# Patient Record
Sex: Male | Born: 1990
Health system: Southern US, Community
[De-identification: ages and names within clinical notes are randomized; demographics above are authoritative.]

## PROBLEM LIST (undated history)

## (undated) DIAGNOSIS — S83242A Other tear of medial meniscus, current injury, left knee, initial encounter: Secondary | ICD-10-CM

## (undated) DIAGNOSIS — J45909 Unspecified asthma, uncomplicated: Secondary | ICD-10-CM

## (undated) DIAGNOSIS — S83207D Unspecified tear of unspecified meniscus, current injury, left knee, subsequent encounter: Secondary | ICD-10-CM

## (undated) DIAGNOSIS — S83512D Sprain of anterior cruciate ligament of left knee, subsequent encounter: Secondary | ICD-10-CM

## (undated) HISTORY — DX: Unspecified asthma, uncomplicated: J45.909

## (undated) HISTORY — PX: NO PAST SURGERIES: SHX2092

---
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
## 2017-03-21 DIAGNOSIS — J302 Other seasonal allergic rhinitis: Secondary | ICD-10-CM | POA: Diagnosis not present

## 2017-03-21 DIAGNOSIS — J209 Acute bronchitis, unspecified: Secondary | ICD-10-CM | POA: Diagnosis not present

## 2017-05-23 DIAGNOSIS — M654 Radial styloid tenosynovitis [de Quervain]: Secondary | ICD-10-CM | POA: Diagnosis not present

## 2017-06-10 ENCOUNTER — Emergency Department (HOSPITAL_COMMUNITY)
Admission: EM | Admit: 2017-06-10 | Discharge: 2017-06-10 | Disposition: A | Discharge status: Home / Self Care | Payer: Managed Care, Other (non HMO) | Attending: Emergency Medicine | Admitting: Emergency Medicine

## 2017-06-10 ENCOUNTER — Emergency Department (HOSPITAL_COMMUNITY): Payer: Managed Care, Other (non HMO)

## 2017-06-10 ENCOUNTER — Encounter (HOSPITAL_COMMUNITY): Payer: Self-pay | Admitting: Emergency Medicine

## 2017-06-10 DIAGNOSIS — M79645 Pain in left finger(s): Secondary | ICD-10-CM | POA: Diagnosis present

## 2017-06-10 NOTE — ED Notes (Signed)
Pt went to move his car and has not come back.

## 2017-06-10 NOTE — ED Notes (Signed)
Pt from home with c/o left middle finger pain following a flag football injury. Pt had good cap refill to finger. No obvious deformity or dislocation

## 2017-07-11 DIAGNOSIS — S83242A Other tear of medial meniscus, current injury, left knee, initial encounter: Secondary | ICD-10-CM | POA: Diagnosis not present

## 2017-07-11 DIAGNOSIS — M79645 Pain in left finger(s): Secondary | ICD-10-CM | POA: Diagnosis not present

## 2017-07-17 DIAGNOSIS — M25562 Pain in left knee: Secondary | ICD-10-CM | POA: Diagnosis not present

## 2017-07-19 DIAGNOSIS — S83512A Sprain of anterior cruciate ligament of left knee, initial encounter: Secondary | ICD-10-CM | POA: Diagnosis not present

## 2017-07-20 DIAGNOSIS — M24542 Contracture, left hand: Secondary | ICD-10-CM | POA: Diagnosis not present

## 2017-07-20 DIAGNOSIS — M79645 Pain in left finger(s): Secondary | ICD-10-CM | POA: Insufficient documentation

## 2017-07-20 DIAGNOSIS — S63633A Sprain of interphalangeal joint of left middle finger, initial encounter: Secondary | ICD-10-CM | POA: Diagnosis not present

## 2017-07-24 ENCOUNTER — Encounter (HOSPITAL_BASED_OUTPATIENT_CLINIC_OR_DEPARTMENT_OTHER): Payer: Self-pay | Admitting: Physician Assistant

## 2017-07-24 DIAGNOSIS — S83242A Other tear of medial meniscus, current injury, left knee, initial encounter: Secondary | ICD-10-CM | POA: Diagnosis present

## 2017-07-24 DIAGNOSIS — S83207D Unspecified tear of unspecified meniscus, current injury, left knee, subsequent encounter: Secondary | ICD-10-CM

## 2017-07-24 DIAGNOSIS — S83512D Sprain of anterior cruciate ligament of left knee, subsequent encounter: Secondary | ICD-10-CM

## 2017-07-24 NOTE — H&P (Signed)
Curtis RiegerGilbert Deleon is an 26 y.o. male.   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  He had a second injury three weeks later playing basketball.  He saw Dr. Farris HasKramer who ordered an MRI that has revealed a complete ACL tear.  He has been in a J&J brace.  He cannot do any kind of turning or twisting activities without feelings of instability and pain.    Past Medical History:  Diagnosis Date  . Acute medial meniscus tear of left knee   . Tears of meniscus and ACL of left knee, subsequent encounter     No past surgical history on file.  No family history on file. Social History:  reports that he has never smoked. He has never used smokeless tobacco. He reports that he does not drink alcohol or use drugs.  Allergies: No Known Allergies  No prescriptions prior to admission.    No results found for this or any previous visit (from the past 48 hour(s)). No results found.  Review of Systems  Constitutional: Negative.   HENT: Negative.   Eyes: Negative.   Respiratory: Negative.   Cardiovascular: Negative.   Gastrointestinal: Negative.   Genitourinary: Negative.   Musculoskeletal: Positive for joint pain.  Skin: Negative.   Neurological: Negative.   Endo/Heme/Allergies: Negative.   Psychiatric/Behavioral: Negative.     There were no vitals taken for this visit. Physical Exam  Constitutional: He is oriented to person, place, and time. He appears well-developed and well-nourished.  HENT:  Head: Normocephalic and atraumatic.  Eyes: Pupils are equal, round, and reactive to light. Conjunctivae are normal.  Neck: Neck supple.  Cardiovascular: Normal rate.   GI: Soft.  Genitourinary:  Genitourinary Comments: Not pertinent to current symptomatology therefore not examined.  Musculoskeletal:   Examination of his left  knee reveals 1+ effusion.  2-3+ Lachman.  Range of motion is from 0-120 degrees.  Knee is otherwise stable with normal patella tracking.  Examination of the right knee reveals full range of motion without pain, swelling, weakness or instability.  Vascular exam: Pulses are 2+ and symmetric.    Neurological: He is alert and oriented to person, place, and time.  Skin: Skin is warm and dry.  Psychiatric: He has a normal mood and affect.     Assessment Principal Problem:   Tears of meniscus and ACL of left knee, subsequent encounter Active Problems:   Acute medial meniscus tear of left knee   Plan I have talked to him about this in detail.  Would recommend with these findings that we proceed with left knee hamstring autograft ACL reconstruction with attention to his meniscal pathology.  Risks, complications and benefits of the surgery have been described to him in detail and he understands this completely.    Pascal LuxSHEPPERSON,Willetta York J, PA-C 07/26/2017, 4:35 PM

## 2017-08-01 ENCOUNTER — Encounter (HOSPITAL_BASED_OUTPATIENT_CLINIC_OR_DEPARTMENT_OTHER): Payer: Self-pay | Admitting: *Deleted

## 2017-08-07 ENCOUNTER — Encounter (HOSPITAL_BASED_OUTPATIENT_CLINIC_OR_DEPARTMENT_OTHER): Payer: Self-pay | Admitting: *Deleted

## 2017-08-07 ENCOUNTER — Ambulatory Visit (HOSPITAL_BASED_OUTPATIENT_CLINIC_OR_DEPARTMENT_OTHER)
Admission: RE | Admit: 2017-08-07 | Discharge: 2017-08-07 | Disposition: A | Discharge status: Home / Self Care | Payer: 59 | Source: Ambulatory Visit | Attending: Orthopedic Surgery | Admitting: Orthopedic Surgery

## 2017-08-07 ENCOUNTER — Encounter (HOSPITAL_BASED_OUTPATIENT_CLINIC_OR_DEPARTMENT_OTHER)
Admission: RE | Disposition: A | Discharge status: Home / Self Care | Payer: Self-pay | Source: Ambulatory Visit | Attending: Orthopedic Surgery

## 2017-08-07 ENCOUNTER — Ambulatory Visit (HOSPITAL_BASED_OUTPATIENT_CLINIC_OR_DEPARTMENT_OTHER): Payer: 59 | Admitting: Anesthesiology

## 2017-08-07 DIAGNOSIS — X501XXA Overexertion from prolonged static or awkward postures, initial encounter: Secondary | ICD-10-CM | POA: Insufficient documentation

## 2017-08-07 DIAGNOSIS — Y9363 Activity, rugby: Secondary | ICD-10-CM | POA: Insufficient documentation

## 2017-08-07 DIAGNOSIS — S83512A Sprain of anterior cruciate ligament of left knee, initial encounter: Secondary | ICD-10-CM | POA: Insufficient documentation

## 2017-08-07 DIAGNOSIS — S83242A Other tear of medial meniscus, current injury, left knee, initial encounter: Secondary | ICD-10-CM | POA: Diagnosis not present

## 2017-08-07 DIAGNOSIS — X58XXXA Exposure to other specified factors, initial encounter: Secondary | ICD-10-CM | POA: Insufficient documentation

## 2017-08-07 DIAGNOSIS — S83509A Sprain of unspecified cruciate ligament of unspecified knee, initial encounter: Secondary | ICD-10-CM | POA: Diagnosis not present

## 2017-08-07 DIAGNOSIS — S83512D Sprain of anterior cruciate ligament of left knee, subsequent encounter: Secondary | ICD-10-CM

## 2017-08-07 DIAGNOSIS — Y9367 Activity, basketball: Secondary | ICD-10-CM | POA: Diagnosis not present

## 2017-08-07 DIAGNOSIS — S83282A Other tear of lateral meniscus, current injury, left knee, initial encounter: Secondary | ICD-10-CM | POA: Diagnosis not present

## 2017-08-07 DIAGNOSIS — G8918 Other acute postprocedural pain: Secondary | ICD-10-CM | POA: Diagnosis not present

## 2017-08-07 DIAGNOSIS — S83207D Unspecified tear of unspecified meniscus, current injury, left knee, subsequent encounter: Secondary | ICD-10-CM

## 2017-08-07 DIAGNOSIS — S83249A Other tear of medial meniscus, current injury, unspecified knee, initial encounter: Secondary | ICD-10-CM | POA: Diagnosis not present

## 2017-08-07 HISTORY — DX: Unspecified tear of unspecified meniscus, current injury, left knee, subsequent encounter: S83.207D

## 2017-08-07 HISTORY — PX: KNEE ARTHROSCOPY WITH ANTERIOR CRUCIATE LIGAMENT (ACL) REPAIR WITH HAMSTRING GRAFT: SHX5645

## 2017-08-07 HISTORY — DX: Other tear of medial meniscus, current injury, left knee, initial encounter: S83.242A

## 2017-08-07 HISTORY — PX: KNEE ARTHROSCOPY WITH MEDIAL MENISECTOMY: SHX5651

## 2017-08-07 HISTORY — DX: Unspecified tear of unspecified meniscus, current injury, left knee, subsequent encounter: S83.512D

## 2017-08-07 SURGERY — ARTHROSCOPY, KNEE, WITH MEDIAL MENISCECTOMY
Anesthesia: General | Site: Knee | Laterality: Left

## 2017-08-07 MED ORDER — FENTANYL CITRATE (PF) 100 MCG/2ML IJ SOLN
INTRAMUSCULAR | Status: AC
  Filled 2017-08-07: qty 2

## 2017-08-07 MED ORDER — DEXAMETHASONE SODIUM PHOSPHATE 4 MG/ML IJ SOLN
INTRAMUSCULAR | Status: DC | PRN
  Administered 2017-08-07: 10 mg via INTRAVENOUS

## 2017-08-07 MED ORDER — PROPOFOL 10 MG/ML IV BOLUS
INTRAVENOUS | Status: DC | PRN
  Administered 2017-08-07: 250 mg via INTRAVENOUS

## 2017-08-07 MED ORDER — FENTANYL CITRATE (PF) 100 MCG/2ML IJ SOLN
25.0000 ug | INTRAMUSCULAR | Status: DC | PRN
  Administered 2017-08-07: 50 ug via INTRAVENOUS
  Administered 2017-08-07 (×2): 25 ug via INTRAVENOUS

## 2017-08-07 MED ORDER — CYCLOBENZAPRINE HCL 5 MG PO TABS: 5.0000 mg | ORAL_TABLET | Freq: Three times a day (TID) | ORAL | 0 refills | Status: DC | PRN

## 2017-08-07 MED ORDER — CEFAZOLIN SODIUM-DEXTROSE 2-4 GM/100ML-% IV SOLN
2.0000 g | INTRAVENOUS | Status: AC
  Administered 2017-08-07: 2 g via INTRAVENOUS

## 2017-08-07 MED ORDER — POVIDONE-IODINE 7.5 % EX SOLN: Freq: Once | CUTANEOUS | Status: DC

## 2017-08-07 MED ORDER — BUPIVACAINE-EPINEPHRINE 0.25% -1:200000 IJ SOLN
INTRAMUSCULAR | Status: AC
  Filled 2017-08-07: qty 2

## 2017-08-07 MED ORDER — BUPIVACAINE-EPINEPHRINE (PF) 0.5% -1:200000 IJ SOLN
INTRAMUSCULAR | Status: DC | PRN
  Administered 2017-08-07: 20 mL via PERINEURAL

## 2017-08-07 MED ORDER — LIDOCAINE HCL (CARDIAC) 20 MG/ML IV SOLN
INTRAVENOUS | Status: DC | PRN
  Administered 2017-08-07: 50 mg via INTRAVENOUS

## 2017-08-07 MED ORDER — DIAZEPAM 5 MG/ML IJ SOLN
2.5000 mg | INTRAMUSCULAR | Status: AC | PRN
  Administered 2017-08-07 (×2): 2.5 mg via INTRAVENOUS

## 2017-08-07 MED ORDER — MIDAZOLAM HCL 2 MG/2ML IJ SOLN
1.0000 mg | INTRAMUSCULAR | Status: DC | PRN
  Administered 2017-08-07: 2 mg via INTRAVENOUS

## 2017-08-07 MED ORDER — CEFAZOLIN SODIUM-DEXTROSE 2-4 GM/100ML-% IV SOLN
INTRAVENOUS | Status: AC
Start: 2017-08-07 — End: 2017-08-07
  Filled 2017-08-07: qty 100

## 2017-08-07 MED ORDER — FENTANYL CITRATE (PF) 100 MCG/2ML IJ SOLN
50.0000 ug | INTRAMUSCULAR | Status: AC | PRN
  Administered 2017-08-07 (×2): 50 ug via INTRAVENOUS
  Administered 2017-08-07 (×2): 25 ug via INTRAVENOUS

## 2017-08-07 MED ORDER — LACTATED RINGERS IV SOLN
INTRAVENOUS | Status: DC
  Administered 2017-08-07: 13:00:00 via INTRAVENOUS
  Administered 2017-08-07: 10 mL/h via INTRAVENOUS
  Administered 2017-08-07: 11:00:00 via INTRAVENOUS

## 2017-08-07 MED ORDER — BUPIVACAINE-EPINEPHRINE 0.25% -1:200000 IJ SOLN
INTRAMUSCULAR | Status: DC | PRN
  Administered 2017-08-07: 20 mL

## 2017-08-07 MED ORDER — DIAZEPAM 5 MG/ML IJ SOLN
INTRAMUSCULAR | Status: AC
  Filled 2017-08-07: qty 2

## 2017-08-07 MED ORDER — OXYCODONE HCL 5 MG PO TABS: ORAL_TABLET | ORAL | 0 refills | Status: DC

## 2017-08-07 MED ORDER — EPINEPHRINE 30 MG/30ML IJ SOLN
INTRAMUSCULAR | Status: AC
  Filled 2017-08-07: qty 1

## 2017-08-07 MED ORDER — MIDAZOLAM HCL 2 MG/2ML IJ SOLN
INTRAMUSCULAR | Status: AC
  Filled 2017-08-07: qty 2

## 2017-08-07 MED ORDER — CHLORHEXIDINE GLUCONATE 4 % EX LIQD: 60.0000 mL | Freq: Once | CUTANEOUS | Status: DC

## 2017-08-07 MED ORDER — LACTATED RINGERS IV SOLN: INTRAVENOUS | Status: DC

## 2017-08-07 MED ORDER — SODIUM CHLORIDE 0.9 % IR SOLN
Status: DC | PRN
  Administered 2017-08-07: 6000 mL

## 2017-08-07 MED ORDER — DIAZEPAM 2 MG PO TABS
2.0000 mg | ORAL_TABLET | ORAL | Status: DC | PRN
  Filled 2017-08-07: qty 1

## 2017-08-07 MED ORDER — SCOPOLAMINE 1 MG/3DAYS TD PT72: 1.0000 | MEDICATED_PATCH | Freq: Once | TRANSDERMAL | Status: DC | PRN

## 2017-08-07 MED ORDER — PROMETHAZINE HCL 25 MG/ML IJ SOLN: 6.2500 mg | INTRAMUSCULAR | Status: DC | PRN

## 2017-08-07 MED FILL — CYCLOBENZAPRINE 5 MG TABLET: 5 | 6 days supply | Qty: 20 | Fill #0

## 2017-08-07 MED FILL — oxyCODONE HCL 5 MG TABS: 5 | 2 days supply | Qty: 30 | Fill #0

## 2017-08-07 SURGICAL SUPPLY — 113 items
ANCHOR BUTTON TIGHTROPE ACL RT (Orthopedic Implant) ×3 IMPLANT
ANCHOR BUTTON TIGHTROPE RN 14 (Anchor) ×3 IMPLANT
ANCHOR PUSHLOCK PEEK 3.5X19.5 (Anchor) ×3 IMPLANT
BANDAGE ACE 4X5 VEL STRL LF (GAUZE/BANDAGES/DRESSINGS) ×3 IMPLANT
BANDAGE ACE 6X5 VEL STRL LF (GAUZE/BANDAGES/DRESSINGS) ×3 IMPLANT
BANDAGE ESMARK 6X9 LF (GAUZE/BANDAGES/DRESSINGS) IMPLANT
BENZOIN TINCTURE PRP APPL 2/3 (GAUZE/BANDAGES/DRESSINGS) ×3 IMPLANT
BLADE CUDA GRT WHITE 3.5 (BLADE) ×3 IMPLANT
BLADE CUTTER GATOR 3.5 (BLADE) ×3 IMPLANT
BLADE HEX COATED 2.75 (ELECTRODE) ×3 IMPLANT
BLADE SURG 15 STRL LF DISP TIS (BLADE) ×1 IMPLANT
BLADE SURG 15 STRL SS (BLADE) ×2
BNDG COHESIVE 4X5 TAN STRL (GAUZE/BANDAGES/DRESSINGS) IMPLANT
BNDG ESMARK 6X9 LF (GAUZE/BANDAGES/DRESSINGS)
BONE TUNNEL PLUG CANNULATED (MISCELLANEOUS) IMPLANT
BUR OVAL 6.0 (BURR) ×3 IMPLANT
CLOSURE WOUND 1/2 X4 (GAUZE/BANDAGES/DRESSINGS) ×1
COVER BACK TABLE 60X90IN (DRAPES) ×3 IMPLANT
CUTTER FLIP II 9.5MM (INSTRUMENTS) ×3 IMPLANT
DECANTER SPIKE VIAL GLASS SM (MISCELLANEOUS) IMPLANT
DRAPE ARTHROSCOPY W/POUCH 90 (DRAPES) ×3 IMPLANT
DRAPE IMP U-DRAPE 54X76 (DRAPES) ×3 IMPLANT
DRAPE OEC MINIVIEW 54X84 (DRAPES) ×3 IMPLANT
DRAPE U-SHAPE 47X51 STRL (DRAPES) ×3 IMPLANT
DRAPE U-SHAPE 76X120 STRL (DRAPES) ×3 IMPLANT
DRILL FLIPCUTTER II 10.5MM (CUTTER) IMPLANT
DRILL FLIPCUTTER II 10MM (CUTTER) IMPLANT
DRILL FLIPCUTTER II 7.0MM (INSTRUMENTS) IMPLANT
DRILL FLIPCUTTER II 7.5MM (MISCELLANEOUS) IMPLANT
DRILL FLIPCUTTER II 8.0MM (INSTRUMENTS) IMPLANT
DRILL FLIPCUTTER II 8.5MM (INSTRUMENTS) IMPLANT
DRILL FLIPCUTTER II 9.0MM (INSTRUMENTS) IMPLANT
DRSG PAD ABDOMINAL 8X10 ST (GAUZE/BANDAGES/DRESSINGS) IMPLANT
DURAPREP 26ML APPLICATOR (WOUND CARE) ×3 IMPLANT
ELECT REM PT RETURN 9FT ADLT (ELECTROSURGICAL) ×3
ELECTRODE REM PT RTRN 9FT ADLT (ELECTROSURGICAL) ×1 IMPLANT
FLIP CUTTER II 7.0MM (INSTRUMENTS)
FLIPCUTTER II 10.5MM (CUTTER)
FLIPCUTTER II 10MM (CUTTER)
FLIPCUTTER II 7.5MM (MISCELLANEOUS)
FLIPCUTTER II 8.0MM (INSTRUMENTS)
FLIPCUTTER II 8.5MM (INSTRUMENTS)
FLIPCUTTER II 9.0MM (INSTRUMENTS)
GAUZE SPONGE 4X4 12PLY STRL (GAUZE/BANDAGES/DRESSINGS) ×3 IMPLANT
GAUZE XEROFORM 1X8 LF (GAUZE/BANDAGES/DRESSINGS) ×3 IMPLANT
GLOVE BIO SURGEON STRL SZ7 (GLOVE) ×6 IMPLANT
GLOVE BIOGEL PI IND STRL 7.0 (GLOVE) ×4 IMPLANT
GLOVE BIOGEL PI IND STRL 7.5 (GLOVE) ×1 IMPLANT
GLOVE BIOGEL PI INDICATOR 7.0 (GLOVE) ×8
GLOVE BIOGEL PI INDICATOR 7.5 (GLOVE) ×2
GLOVE ECLIPSE 6.5 STRL STRAW (GLOVE) ×6 IMPLANT
GLOVE SS BIOGEL STRL SZ 7.5 (GLOVE) ×2 IMPLANT
GLOVE SUPERSENSE BIOGEL SZ 7.5 (GLOVE) ×4
GOWN STRL REUS W/ TWL LRG LVL3 (GOWN DISPOSABLE) ×2 IMPLANT
GOWN STRL REUS W/ TWL XL LVL3 (GOWN DISPOSABLE) ×3 IMPLANT
GOWN STRL REUS W/TWL LRG LVL3 (GOWN DISPOSABLE) ×4
GOWN STRL REUS W/TWL XL LVL3 (GOWN DISPOSABLE) ×6
GUIDEPIN REAMER CUTTER 11MM (INSTRUMENTS) IMPLANT
HOLDER KNEE FOAM BLUE (MISCELLANEOUS) ×3 IMPLANT
IMMOBILIZER KNEE 22 UNIV (SOFTGOODS) IMPLANT
IMMOBILIZER KNEE 24 THIGH 36 (MISCELLANEOUS) ×1 IMPLANT
IMMOBILIZER KNEE 24 UNIV (MISCELLANEOUS) ×3
K-WIRE .062X4 (WIRE) IMPLANT
KNEE WRAP E Z 3 GEL PACK (MISCELLANEOUS) ×3 IMPLANT
LOOP 2 FIBERLINK CLOSED (SUTURE) IMPLANT
MANIFOLD NEPTUNE II (INSTRUMENTS) ×3 IMPLANT
MARKER SKIN DUAL TIP RULER LAB (MISCELLANEOUS) ×3 IMPLANT
NDL SAFETY ECLIPSE 18X1.5 (NEEDLE) ×1 IMPLANT
NEEDLE HYPO 18GX1.5 SHARP (NEEDLE) ×2
NEEDLE HYPO 22GX1.5 SAFETY (NEEDLE) ×3 IMPLANT
PACK ARTHROSCOPY DSU (CUSTOM PROCEDURE TRAY) ×3 IMPLANT
PACK BASIN DAY SURGERY FS (CUSTOM PROCEDURE TRAY) ×3 IMPLANT
PAD ALCOHOL SWAB (MISCELLANEOUS) ×6 IMPLANT
PAD CAST 4YDX4 CTTN HI CHSV (CAST SUPPLIES) IMPLANT
PADDING CAST COTTON 4X4 STRL (CAST SUPPLIES)
PENCIL BUTTON HOLSTER BLD 10FT (ELECTRODE) ×3 IMPLANT
PIN DRILL ACL TIGHTROPE 4MM (PIN) IMPLANT
PK GRAFTLINK AUTO IMPLANT SYST (Anchor) ×3 IMPLANT
SET ARTHROSCOPY TUBING (MISCELLANEOUS) ×2
SET ARTHROSCOPY TUBING LN (MISCELLANEOUS) ×1 IMPLANT
SLEEVE SCD COMPRESS KNEE MED (MISCELLANEOUS) ×3 IMPLANT
SPONGE LAP 4X18 X RAY DECT (DISPOSABLE) ×3 IMPLANT
STOCKING TED THIGH LEN LRG REG (STOCKING) ×2
STOCKING TED THIGH LEN MED REG (STOCKING)
STOCKING THIGH LG REG (STOCKING) ×1 IMPLANT
STOCKING THIGH MED REG (STOCKING) IMPLANT
STRIP CLOSURE SKIN 1/2X4 (GAUZE/BANDAGES/DRESSINGS) ×2 IMPLANT
SUCTION FRAZIER HANDLE 10FR (MISCELLANEOUS) ×2
SUCTION TUBE FRAZIER 10FR DISP (MISCELLANEOUS) ×1 IMPLANT
SUT 2 FIBERLOOP 20 STRT BLUE (SUTURE)
SUT ETHILON 4 0 PS 2 18 (SUTURE) ×3 IMPLANT
SUT FIBERWIRE #2 38 T-5 BLUE (SUTURE)
SUT PDS AB 0 CT 36 (SUTURE) IMPLANT
SUT PROLENE 3 0 PS 2 (SUTURE) ×3 IMPLANT
SUT VIC AB 0 CT1 18XCR BRD 8 (SUTURE) IMPLANT
SUT VIC AB 0 CT1 8-18 (SUTURE)
SUT VIC AB 2-0 CT1 27 (SUTURE)
SUT VIC AB 2-0 CT1 TAPERPNT 27 (SUTURE) IMPLANT
SUT VIC AB 3-0 PS1 18 (SUTURE)
SUT VIC AB 3-0 PS1 18XBRD (SUTURE) IMPLANT
SUT VIC AB 3-0 SH 27 (SUTURE) ×2
SUT VIC AB 3-0 SH 27X BRD (SUTURE) ×1 IMPLANT
SUT VICRYL 0 CT-2 (SUTURE) ×6 IMPLANT
SUTURE 2 FIBERLOOP 20 STRT BLU (SUTURE) IMPLANT
SUTURE FIBERWR #2 38 T-5 BLUE (SUTURE) IMPLANT
SYR 20CC LL (SYRINGE) ×3 IMPLANT
SYR 5ML LL (SYRINGE) ×3 IMPLANT
SYSTEM GRAFT IMPLANT AUTOGRAFT (Anchor) ×1 IMPLANT
TOWEL OR 17X24 6PK STRL BLUE (TOWEL DISPOSABLE) ×6 IMPLANT
TOWEL OR NON WOVEN STRL DISP B (DISPOSABLE) ×3 IMPLANT
TUBING ARTHROSCOPY IRRIG 16FT (MISCELLANEOUS) ×3 IMPLANT
WAND STAR VAC 90 (SURGICAL WAND) IMPLANT
WATER STERILE IRR 1000ML POUR (IV SOLUTION) ×3 IMPLANT

## 2017-08-07 NOTE — Anesthesia Postprocedure Evaluation (Signed)
Anesthesia Post Note  Patient: [name]  Procedure(s) Performed: Procedure(s) (LRB): LEFT KNEE ARTHROSCOPY WITH MEDIAL MENISECTOMY (Left) LEFT KNEE ARTHROSCOPY WITH ANTERIOR CRUCIATE LIGAMENT (ACL) REPAIR WITH HAMSTRING GRAFT (Left)     Patient location during evaluation: PACU Anesthesia Type: General Level of consciousness: awake and alert Pain management: pain level controlled (Valium given to help with hamstring spasms) Vital Signs Assessment: post-procedure vital signs reviewed and stable Respiratory status: spontaneous breathing, nonlabored ventilation and respiratory function stable Cardiovascular status: blood pressure returned to baseline and stable Postop Assessment: no signs of nausea or vomiting Anesthetic complications: no    Last Vitals:  Vitals:   08/07/17 1500 08/07/17 1515  BP: (!) 142/88 (!) 142/84  Pulse: 76 75  Resp: 13 12  Temp:    SpO2: 98% 100%    Last Pain:  Vitals:   08/07/17 1500  TempSrc:   PainSc: 6                  Curtis Deleon

## 2017-08-07 NOTE — Anesthesia Procedure Notes (Signed)
Anesthesia Regional Block: Adductor canal block   Pre-Anesthetic Checklist: ,, timeout performed, Correct Patient, Correct Site, Correct Laterality, Correct Procedure, Correct Position, site marked, Risks and benefits discussed,  Surgical consent,  Pre-op evaluation,  At surgeon's request and post-op pain management  Laterality: Left  Prep: chloraprep       Needles:  Injection technique: Single-shot  Needle Type: Echogenic Needle     Needle Length: 9cm  Needle Gauge: 21     Additional Needles:   Procedures: ultrasound guided,,,,,,,,  Narrative:  Start time: 08/07/2017 11:52 AM End time: 08/07/2017 11:55 AM Injection made incrementally with aspirations every 5 mL.  Performed by: Personally  Anesthesiologist: Leslye Peer E  Additional Notes: No pain on injection. No increased resistance to injection. Injection made in 5cc increments. Good needle visualization. Patient tolerated the procedure well.

## 2017-08-07 NOTE — Interval H&P Note (Signed)
History and Physical Interval Note:  08/07/2017 11:44 AM  Curtis Deleon  has presented today for surgery, with the diagnosis of Other tear of medial meniscus current injury unspecified knee initial encounter left Sprian of unspecified cruciate ligament of unspecified knee initial encounter left  S83.249A  S83.509A  The various methods of treatment have been discussed with the patient and family. After consideration of risks, benefits and other options for treatment, the patient has consented to  Procedure(s): LEFT KNEE ARTHROSCOPY WITH MEDIAL MENISECTOMY (Left) LEFT KNEE ARTHROSCOPY WITH ANTERIOR CRUCIATE LIGAMENT (ACL) REPAIR WITH HAMSTRING GRAFT (Left) as a surgical intervention .  The patient's history has been reviewed, patient examined, no change in status, stable for surgery.  I have reviewed the patient's chart and labs.  Questions were answered to the patient's satisfaction.     Salvatore Marvel A

## 2017-08-07 NOTE — Discharge Instructions (Signed)

## 2017-08-07 NOTE — Progress Notes (Signed)
AssistedDr. Brock with left, ultrasound guided, adductor canal block. Side rails up, monitors on throughout procedure. See vital signs in flow sheet. Tolerated Procedure well.  

## 2017-08-07 NOTE — Anesthesia Preprocedure Evaluation (Signed)
Anesthesia Evaluation  Patient identified by MRN, date of birth, ID band Patient awake    Reviewed: Allergy & Precautions, NPO status , Patient's Chart, lab work & pertinent test results  Airway Mallampati: II  TM Distance: >3 FB Neck ROM: Full    Dental no notable dental hx. (+) Dental Advisory Given   Pulmonary neg pulmonary ROS,    Pulmonary exam normal breath sounds clear to auscultation       Cardiovascular negative cardio ROS Normal cardiovascular exam Rhythm:Regular Rate:Normal     Neuro/Psych negative neurological ROS  negative psych ROS   GI/Hepatic negative GI ROS, Neg liver ROS,   Endo/Other  negative endocrine ROS  Renal/GU negative Renal ROS  negative genitourinary   Musculoskeletal negative musculoskeletal ROS (+)   Abdominal   Peds  Hematology negative hematology ROS (+)   Anesthesia Other Findings   Reproductive/Obstetrics                            Anesthesia Physical Anesthesia Plan  ASA: I  Anesthesia Plan: General   Post-op Pain Management:  Regional for Post-op pain   Induction: Intravenous  PONV Risk Score and Plan: 2 and Ondansetron, Dexamethasone and Treatment may vary due to age or medical condition  Airway Management Planned: LMA  Additional Equipment:   Intra-op Plan:   Post-operative Plan: Extubation in OR  Informed Consent: I have reviewed the patients History and Physical, chart, labs and discussed the procedure including the risks, benefits and alternatives for the proposed anesthesia with the patient or authorized representative who has indicated his/her understanding and acceptance.   Dental advisory given  Plan Discussed with: CRNA  Anesthesia Plan Comments:         Anesthesia Quick Evaluation

## 2017-08-07 NOTE — Anesthesia Procedure Notes (Signed)
Procedure Name: LMA Insertion Date/Time: 08/07/2017 12:19 PM Performed by: Zenia Resides D Pre-anesthesia Checklist: Patient identified, Emergency Drugs available, Suction available and Patient being monitored Patient Re-evaluated:Patient Re-evaluated prior to induction Oxygen Delivery Method: Circle system utilized Preoxygenation: Pre-oxygenation with 100% oxygen Induction Type: IV induction Ventilation: Mask ventilation without difficulty LMA: LMA inserted LMA Size: 4.0 Number of attempts: 1 Airway Equipment and Method: Bite block Placement Confirmation: positive ETCO2 Tube secured with: Tape Dental Injury: Teeth and Oropharynx as per pre-operative assessment

## 2017-08-07 NOTE — Op Note (Signed)
NAME:  Curtis Deleon, SPRINGBORN NO.:  MEDICAL RECORD NO.:  [id number]  LOCATION:                                 FACILITY:  PHYSICIAN:  Nadra Hritz A. Thurston Hole, M.D.      DATE OF BIRTH:  DATE OF PROCEDURE:  08/07/2017 DATE OF DISCHARGE:                              OPERATIVE REPORT   PREOPERATIVE DIAGNOSES: 1. Left knee acute traumatic anterior cruciate ligament tear. 2. Left knee acute traumatic lateral meniscus tear.  POSTOPERATIVE DIAGNOSES: 1. Left knee acute traumatic anterior cruciate ligament tear. 2. Left knee acute traumatic lateral meniscus tear.  PROCEDURE: 1. Left knee EUA, followed by arthroscopically assisted endoscopic     hamstring autograft, anterior cruciate ligament reconstruction     using Arthrex femoral TightRope with Arthrex tibial button plus     PushLock anchor. 2. Left knee partial lateral meniscectomy.  SURGEON:  Elana Alm. Thurston Hole, M.D.  ASSISTANT:  Julien Girt, PA.  ANESTHESIA:  General.  OPERATIVE TIME:  One hour 15 minutes.  COMPLICATIONS:  None.  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  Exam and MRIs revealed a complete ACL tear with possible lateral meniscus tear.  He is now to undergo arthroscopy with ACL reconstruction with attention to his meniscal pathology.  DESCRIPTION:  Curtis Deleon was brought to the operating room on August 07, 2017, after an adductor canal block was placed in the holding room by Anesthesia.  He was placed on the operating table in supine position. He received antibiotics preoperatively for prophylaxis.  After being placed under general anesthesia, his left knee was examined.  He had full range of motion, 3+ Lachman, positive pivot shift, knee stable to varus, valgus, and posterior stress with normal patellar tracking.  The knee was sterilely injected with 0.25% Marcaine with  epinephrine.  Left leg was then prepped using sterile DuraPrep and draped using sterile technique.  Time-out procedure was called, and the correct left knee identified.  Initially, through an anterolateral portal, the arthroscope with a pump attached was placed into an anteromedial portal and an arthroscopic probe was placed.  On initial inspection of medial compartment, the articular cartilage was normal, medial meniscus was normal.  Intercondylar notch inspected.  The anterior cruciate ligament was completely torn in its mid substance with significant anterior laxity and this was thoroughly debrided and a notchplasty was performed. Posterior cruciate was intact and stable.  Lateral compartment inspected.  The articular cartilage was normal.  Lateral meniscus showed a small tear 10% posterolateral corner, which was resected back to a stable rim.  Patellofemoral joint articular cartilage was normal.  The patella tracked normally.  Medial and lateral gutters were free of pathology.  At this point, the ACL hamstring graft was harvested through a 3 cm anteromedial proximal tibial incision.  The semitendinosus was harvested using standard technique without complications.  At this point, Julien Girt, whose surgical and medical assistance was absolutely surgically and medically necessary.  She prepared the ACL graft on the back table while I prepared the inside of the knee to accept this graft.  Using an Arthrex 9.5 mm tibial flip cutter, the tibial tunnel was prepared in the anatomic position on the tibial plateau.  Through this tibial tunnel, the posterior femoral guide was placed in the posterior femoral notch and a Steinmann pin drilled up in the ACL origin point and then overdrilled with a 9 mm drill to a depth of 20 mm leaving a posterior 2 mm bone bridge.  A double pin passer was then brought up through the tibial tunnel and joined in up through the femoral tunnel and through the  femoral cortex and thigh through a stab wound.  This was used to pass the Arthrex TightRope and graft up through the tibial tunnel and joined in up into the femoral tunnel.  The TightRope was then deployed on the lateral femoral cortex and confirmed with intraoperative fluoroscopy.  The femoral end of the graft was then deployed in the femoral tunnel with excellent fixation.  The knee was then brought through a full range of motion.  There was found to be no impingement of the graft.  The tibial end of the graft was then locked in position with the Arthrex tibial button while Kirstin Shepperson held the tibia reduced on the femur in 30 degrees of flexion.  The tibial end of the graft was then further secured with a PushLock anchor.  After this was done, the knee was tested for stability.  Lachman and pivot shift were found to be totally eliminated and the knee could be brought through a full range of motion with no impingement of graft.  At this point, felt that all pathology have been satisfactorily addressed.  The instruments were removed.  The anteromedial incision closed with 2-0 Vicryl and 4-0 Prolene.  Arthroscopic portals closed with 4-0 Prolene. Sterile dressings were applied and a long-leg splint, and then, the patient awakened and taken to recovery room in stable condition.  Needle and sponge counts correct x2 at the end of the case.  FOLLOWUP CARE:  Curtis Deleon will be followed as an outpatient on oxycodone and Flexeril with a home CPM.  He will be seen back in office in a week for sutures out and followup.     Sulamita Lafountain A. Thurston Hole, M.D.     RAW/MEDQ  D:  08/07/2017  T:  08/07/2017  Job:  [id number]

## 2017-08-07 NOTE — Transfer of Care (Signed)
Immediate Anesthesia Transfer of Care Note  Patient: Curtis Deleon  Procedure(s) Performed: Procedure(s): LEFT KNEE ARTHROSCOPY WITH MEDIAL MENISECTOMY (Left) LEFT KNEE ARTHROSCOPY WITH ANTERIOR CRUCIATE LIGAMENT (ACL) REPAIR WITH HAMSTRING GRAFT (Left)  Patient Location: PACU  Anesthesia Type:GA combined with regional for post-op pain  Level of Consciousness: sedated  Airway & Oxygen Therapy: Patient Spontanous Breathing and Patient connected to face mask oxygen  Post-op Assessment: Report given to RN and Post -op Vital signs reviewed and stable  Post vital signs: Reviewed and stable  Last Vitals:  Vitals:   08/07/17 1155 08/07/17 1158  BP:    Pulse: (!) 53 (!) 55  Resp: 10 12  Temp:    SpO2: 100% 100%    Last Pain:  Vitals:   08/07/17 1127  TempSrc: Oral         Complications: No apparent anesthesia complications

## 2017-08-08 ENCOUNTER — Encounter (HOSPITAL_BASED_OUTPATIENT_CLINIC_OR_DEPARTMENT_OTHER): Payer: Self-pay | Admitting: Orthopedic Surgery

## 2017-08-12 ENCOUNTER — Encounter (HOSPITAL_COMMUNITY): Payer: Self-pay | Admitting: Emergency Medicine

## 2017-08-12 DIAGNOSIS — M79605 Pain in left leg: Secondary | ICD-10-CM | POA: Diagnosis not present

## 2017-08-12 DIAGNOSIS — M79662 Pain in left lower leg: Secondary | ICD-10-CM | POA: Insufficient documentation

## 2017-08-12 DIAGNOSIS — Z9889 Other specified postprocedural states: Secondary | ICD-10-CM | POA: Insufficient documentation

## 2017-08-12 DIAGNOSIS — R509 Fever, unspecified: Secondary | ICD-10-CM | POA: Diagnosis not present

## 2017-08-12 NOTE — ED Triage Notes (Signed)
Reports having ACL repair on left leg on Tuesday.  Having fevers max of 102 and increased pain in calf.  Also reports no bm since Tuesday.  Spoke with physician on call and was sent here to check for blood clot in calf.

## 2017-08-13 ENCOUNTER — Emergency Department (HOSPITAL_COMMUNITY)
Admission: EM | Admit: 2017-08-13 | Discharge: 2017-08-13 | Disposition: A | Discharge status: Home / Self Care | Payer: 59 | Attending: Emergency Medicine | Admitting: Emergency Medicine

## 2017-08-13 ENCOUNTER — Ambulatory Visit (HOSPITAL_BASED_OUTPATIENT_CLINIC_OR_DEPARTMENT_OTHER)
Admission: RE | Admit: 2017-08-13 | Discharge: 2017-08-13 | Disposition: A | Discharge status: Home / Self Care | Payer: 59 | Source: Ambulatory Visit | Attending: Emergency Medicine | Admitting: Emergency Medicine

## 2017-08-13 DIAGNOSIS — R509 Fever, unspecified: Secondary | ICD-10-CM | POA: Diagnosis not present

## 2017-08-13 DIAGNOSIS — M79662 Pain in left lower leg: Secondary | ICD-10-CM | POA: Insufficient documentation

## 2017-08-13 DIAGNOSIS — M79609 Pain in unspecified limb: Secondary | ICD-10-CM

## 2017-08-13 DIAGNOSIS — R600 Localized edema: Secondary | ICD-10-CM

## 2017-08-13 DIAGNOSIS — Z9889 Other specified postprocedural states: Secondary | ICD-10-CM | POA: Diagnosis not present

## 2017-08-13 MED ORDER — ENOXAPARIN SODIUM 80 MG/0.8ML ~~LOC~~ SOLN
1.0000 mg/kg | Freq: Once | SUBCUTANEOUS | Status: AC
  Administered 2017-08-13: 03:00:00 65 mg via SUBCUTANEOUS
  Filled 2017-08-13: qty 0.8

## 2017-08-13 MED ORDER — IBUPROFEN 800 MG PO TABS
800.0000 mg | ORAL_TABLET | Freq: Once | ORAL | Status: AC
  Administered 2017-08-13: 800 mg via ORAL
  Filled 2017-08-13: qty 1

## 2017-08-13 NOTE — ED Provider Notes (Signed)
[facility]-EMERGENCY DEPT Provider Note   CSN: [id number] Arrival date & time: 08/12/17  2115     History   Chief Complaint Chief Complaint  Patient presents with  . Post-op Problem    HPI Curtis Deleon is a 26 y.o. male.  HPI   26 year old male presenting for evaluation of calf pain. 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 He was found to have a complete anterior cruciate ligament tear on MRI of his left knee. He subsequently had a surgical repair performed by Dr. Thurston HoleWainer on 8/28 2018. He is here with complaints of pain in his left calf and fever. Patient noticed pain to his left calf earlier today. Describe pain as a sharp and tightness sensation, moderate in severity, worse with standing. He also felt feverish. He denies having headache, productive cough, chest pain, shortness of breath, abdominal pain, back pain, dysuria, or rash. He did reach out to his orthopedist and was told to come to the ER to be evaluated for potential DVT. He denies any numbness down his leg.  Past Medical History:  Diagnosis Date  . Acute medial meniscus tear of left knee   . Tears of meniscus and ACL of left knee, subsequent encounter     Patient Active Problem List   Diagnosis Date Noted  . Tears of meniscus and ACL of left knee, subsequent encounter   . Acute medial meniscus tear of left knee     Past Surgical History:  Procedure Laterality Date  . KNEE ARTHROSCOPY WITH ANTERIOR CRUCIATE LIGAMENT (ACL) REPAIR WITH HAMSTRING GRAFT Left 08/07/2017   Procedure: LEFT KNEE ARTHROSCOPY WITH ANTERIOR CRUCIATE LIGAMENT (ACL) REPAIR WITH HAMSTRING GRAFT;  Surgeon: Salvatore MarvelWainer, Robert, MD;  Location: [facility];  Service: Orthopedics;  Laterality: Left;  . KNEE ARTHROSCOPY WITH MEDIAL MENISECTOMY Left 08/07/2017   Procedure: LEFT KNEE ARTHROSCOPY WITH MEDIAL MENISECTOMY;  Surgeon: Salvatore MarvelWainer, Robert, MD;  Location: [facility];  Service: Orthopedics;  Laterality: Left;  . NO PAST SURGERIES         Home Medications    Prior to Admission medications   Medication Sig Start Date End Date Taking? Authorizing Provider  cyclobenzaprine (FLEXERIL) 5 MG tablet Take 1 tablet (5 mg total) by mouth 3 (three) times daily as needed for muscle spasms. 08/07/17   Shepperson, Kirstin, PA-C  oxyCODONE (ROXICODONE) 5 MG immediate release tablet 1-2 tablets every 4-6 hrs as needed for pain 08/07/17   Shepperson, Kirstin, PA-C    Family History No family history on file.  Social History Social History  Substance Use Topics  . Smoking status: Never Smoker  . Smokeless tobacco: Never Used  . Alcohol use No     Allergies   Patient has no known allergies.   Review of Systems Review of Systems  All other systems reviewed and are negative.    Physical Exam Updated Vital Signs BP 121/67 (BP Location: Right Arm)   Pulse 64   Temp (!) 100.8 F (38.2 C) (Oral)   Resp 18   Ht 5\' 10"  (1.778 m)   Wt 65.8 kg (145 lb)   SpO2 99%   BMI 20.81 kg/m   Physical Exam  Constitutional: He appears well-developed and well-nourished. No distress.  HENT:  Head: Atraumatic.  Eyes: Conjunctivae are normal.  Neck: Neck supple.  Cardiovascular: Normal rate and regular rhythm.   Pulmonary/Chest: Effort normal and breath sounds normal. No respiratory  distress. He has no wheezes. He has no rales. He exhibits no tenderness.  Abdominal: Soft. He exhibits no distension. There is no tenderness.  Musculoskeletal: He exhibits tenderness (Left lower extremity: Left knee is mildly edematous with a normal appearing surgical scar. Tenderness with knee flexion and extension. Left calf is tender to palpation. 1+ pitting edema to lower extremity with intact distal pulses. Sensation is intact.).  Neurological: He is alert.  Skin: No rash noted.  Psychiatric: He has a normal mood and affect.  Nursing note and vitals  reviewed.    ED Treatments / Results  Labs (all labs ordered are listed, but only abnormal results are displayed) Labs Reviewed - No data to display  EKG  EKG Interpretation None       Radiology No results found.  Procedures Procedures (including critical care time)  Medications Ordered in ED Medications - No data to display   Initial Impression / Assessment and Plan / ED Course  I have reviewed the triage vital signs and the nursing notes.  Pertinent labs & imaging results that were available during my care of the patient were reviewed by me and considered in my medical decision making (see chart for details).     BP 121/67 (BP Location: Right Arm)   Pulse 64   Temp (!) 100.8 F (38.2 C) (Oral)   Resp 18   Ht 5\' 10"  (1.778 m)   Wt 65.8 kg (145 lb)   SpO2 99%   BMI 20.81 kg/m    Final Clinical Impressions(s) / ED Diagnoses   Final diagnoses:  Pain of left calf    New Prescriptions New Prescriptions   No medications on file   1:52 AM Patient here with low-grade fever, increased left calf pain and leg swelling after recent left anterior cruciate ligament repair performed on 08/07/2017. He does not have any other symptoms to suggest other infectious etiology. He will need to evaluate for the potential DVT. No chest pain or shortness of breath or productive cough concerning for PE. Patient will benefit from a venous Doppler study however, vascular tech is not available at this time. Therefore, patient will receive Lovenox injection and will return tomorrow for DVT study.   Fayrene Helper, PA-C 08/13/17 1610    Geoffery Lyons, MD 08/13/17 [phone]

## 2017-08-13 NOTE — Progress Notes (Addendum)
VASCULAR LAB PRELIMINARY  PRELIMINARY  PRELIMINARY  PRELIMINARY  Left lower extremity venous duplex completed.    Preliminary report:  There is no DVT or SVT noted in the left lower extremity. There is a small amount of fluid noted proximal, medial calf.   Karnell Vanderloop, RVT 08/13/2017, 2:29 PM

## 2017-08-13 NOTE — Discharge Instructions (Signed)
Please return in the morning for venous doppler study of left calf pain and swelling.

## 2017-08-14 DIAGNOSIS — S83512D Sprain of anterior cruciate ligament of left knee, subsequent encounter: Secondary | ICD-10-CM | POA: Diagnosis not present

## 2017-08-14 DIAGNOSIS — S83282D Other tear of lateral meniscus, current injury, left knee, subsequent encounter: Secondary | ICD-10-CM | POA: Diagnosis not present

## 2017-08-17 ENCOUNTER — Encounter: Payer: Self-pay | Admitting: Physical Therapy

## 2017-08-17 ENCOUNTER — Ambulatory Visit: Payer: 59 | Attending: Orthopedic Surgery | Admitting: Physical Therapy

## 2017-08-17 DIAGNOSIS — R262 Difficulty in walking, not elsewhere classified: Secondary | ICD-10-CM | POA: Insufficient documentation

## 2017-08-17 DIAGNOSIS — M25662 Stiffness of left knee, not elsewhere classified: Secondary | ICD-10-CM | POA: Insufficient documentation

## 2017-08-17 DIAGNOSIS — R2242 Localized swelling, mass and lump, left lower limb: Secondary | ICD-10-CM | POA: Diagnosis not present

## 2017-08-17 DIAGNOSIS — M25562 Pain in left knee: Secondary | ICD-10-CM | POA: Insufficient documentation

## 2017-08-17 NOTE — Therapy (Signed)
[facility] [place] [address] [place], [place], [address] Phone: [phone]   Fax:  [phone]  Physical Therapy Evaluation  Patient Details  Name: Curtis Deleon MRN: [id number] Date of Birth: 11-05-1991 Referring Provider: Thurston Hole  Encounter Date: 08/17/2017      PT End of Session - 08/17/17 1036    Visit Number 1   Date for PT Re-Evaluation 10/17/17   PT Start Time 1013   PT Stop Time 1110   PT Time Calculation (min) 57 min   Activity Tolerance Patient tolerated treatment well   Behavior During Therapy [facility] for tasks assessed/performed      Past Medical History:  Diagnosis Date  . Acute medial meniscus tear of left knee   . Tears of meniscus and ACL of left knee, subsequent encounter     Past Surgical History:  Procedure Laterality Date  . KNEE ARTHROSCOPY WITH ANTERIOR CRUCIATE LIGAMENT (ACL) REPAIR WITH HAMSTRING GRAFT Left 08/07/2017   Procedure: LEFT KNEE ARTHROSCOPY WITH ANTERIOR CRUCIATE LIGAMENT (ACL) REPAIR WITH HAMSTRING GRAFT;  Surgeon: Salvatore Marvel, MD;  Location: [facility];  Service: Orthopedics;  Laterality: Left;  . KNEE ARTHROSCOPY WITH MEDIAL MENISECTOMY Left 08/07/2017   Procedure: LEFT KNEE ARTHROSCOPY WITH MEDIAL MENISECTOMY;  Surgeon: Salvatore Marvel, MD;  Location: [facility];  Service: Orthopedics;  Laterality: Left;  . NO PAST SURGERIES      There were no vitals filed for this visit.       Subjective Assessment - 08/17/17 1013    Subjective Patient reports that he was in a rugby tournament in July, he then returned to other sports and had a second injury to the knee in early August.  MRI showed ACL tear and medial meniscus tear.  He undewent a left ACL reconstruction with HS graft and meniscus debridement on 08/07/17.     Limitations Walking   Patient Stated Goals recover and play sports again   Currently in Pain? Yes   Pain Score 3    Pain Location Knee    Pain Orientation Left   Pain Descriptors / Indicators Aching;Sore   Pain Type Acute pain;Surgical pain   Pain Onset 1 to 4 weeks ago   Pain Frequency Constant   Aggravating Factors  bending, walking, pain up to 6-7/10   Pain Relieving Factors rest, elevation pain can be 2/10   Effect of Pain on Daily Activities limits walking, movements            [facility] PT Assessment - 08/17/17 0001      Assessment   Medical Diagnosis s/p left ACL reconstruction with HS graft and medial meniscus debridement   Referring Provider Thurston Hole   Onset Date/Surgical Date 08/07/17   Prior Therapy no     Precautions   Precautions None     Restrictions   Other Position/Activity Restrictions WBAT     Balance Screen   Has the patient fallen in the past 6 months No   Has the patient had a decrease in activity level because of a fear of falling?  No   Is the patient reluctant to leave their home because of a fear of falling?  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   Leisure played Rugby, basketball and ultimate frisbee     Observation/Other  Assessments-Edema    Edema Circumferential     Circumferential Edema   Circumferential - Right 36.5 cm   ,  mid quad 54cm   Circumferential - Left  38 cm mid patella   ,  46 cm mid quad showing a lot of atrohoy     ROM / Strength   AROM / PROM / Strength AROM;PROM     AROM   AROM Assessment Site Knee   Right/Left Knee Left   Left Knee Extension 15   Left Knee Flexion 75     PROM   PROM Assessment Site Knee   Right/Left Knee Left   Left Knee Extension 5   Left Knee Flexion 82     Palpation   Palpation comment mild warmth, ecchymosis in the left medial shin and posterior knee     Ambulation/Gait   Gait Comments WBAT, with single crutch, in knee immobilizer, very timid with weight bearing, very slow gait             Objective measurements completed on examination: See above findings.          [facility] Adult PT Treatment/Exercise - 08/17/17 0001      Exercises   Exercises Knee/Hip     Knee/Hip Exercises: Aerobic   Nustep level 3 x 5 minutes     Modalities   Modalities Vasopneumatic     Vasopneumatic   Number Minutes Vasopneumatic  15 minutes   Vasopnuematic Location  Knee   Vasopneumatic Pressure Medium   Vasopneumatic Temperature  34                PT Education - 08/17/17 1036    Education provided Yes   Education Details quad sets, SAQ, assisted heel slides   Person(s) Educated Patient   Methods Explanation;Demonstration   Comprehension Verbalized understanding          PT Short Term Goals - 08/17/17 1047      PT SHORT TERM GOAL #1   Title independent with initial HEP   Time 1   Period Weeks   Status New           PT Long Term Goals - 08/17/17 1047      PT LONG TERM GOAL #1   Title decrease pain 50%   Time 12   Period Weeks   Status New     PT LONG TERM GOAL #2   Title increase AROM of the left knee to WFL's   Time 12   Period Weeks   Status New     PT LONG TERM GOAL #3   Title go up and down stairs reciprocally   Time 12   Period Weeks   Status New     PT LONG TERM GOAL #4   Title increase strength of the left knee to WNL's   Time 12   Period Weeks   Status New     PT LONG TERM GOAL #5   Title walk all distances without deviation and without device   Time 12   Period Weeks   Status New                Plan - 08/17/17 1037    Clinical Impression Statement Patient underwent a left ACL reconstruction with HS graft and medial meniscus debridement on 08/07/17.  His AROM was 15-75 degrees flexion.  He is WBAT with a single crutch and the knee immobilizer.  He has very significant mm atrophy of the left thigh  and the calf   Clinical Presentation Evolving   Clinical Presentation due to: recent surgery   Clinical  Decision Making Low   Rehab Potential Good   PT Frequency 2x / week   PT Duration 12 weeks   PT Treatment/Interventions Cryotherapy;Electrical Stimulation;Functional mobility training;Stair training;Gait training;Therapeutic activities;Therapeutic exercise;Balance training;Neuromuscular re-education;Patient/family education;Manual techniques;Vasopneumatic Device   PT Next Visit Plan slowly progress ROM, follow ACL protocol   Consulted and Agree with Plan of Care Patient      Patient will benefit from skilled therapeutic intervention in order to improve the following deficits and impairments:  Abnormal gait, Decreased activity tolerance, Decreased balance, Decreased mobility, Decreased strength, Increased edema, Pain, Decreased range of motion, Difficulty walking  Visit Diagnosis: Acute pain of left knee - Plan: PT plan of care cert/re-cert  Stiffness of left knee, not elsewhere classified - Plan: PT plan of care cert/re-cert  Difficulty in walking, not elsewhere classified - Plan: PT plan of care cert/re-cert  Localized swelling, mass and lump, left lower limb - Plan: PT plan of care cert/re-cert     Problem List Patient Active Problem List   Diagnosis Date Noted  . Tears of meniscus and ACL of left knee, subsequent encounter   . Acute medial meniscus tear of left knee     Jearld LeschALBRIGHT,Rafael Salway W., PT 08/17/2017, 10:52 AM  [facility] [place] [address] [place], [place], [address] Phone: [phone]   Fax:  [phone]  Name: Curtis Deleon MRN: [id number] Date of Birth: 08/12/1991

## 2017-08-20 ENCOUNTER — Ambulatory Visit: Payer: 59 | Admitting: Physical Therapy

## 2017-08-20 ENCOUNTER — Encounter: Payer: Self-pay | Admitting: Physical Therapy

## 2017-08-20 DIAGNOSIS — R262 Difficulty in walking, not elsewhere classified: Secondary | ICD-10-CM | POA: Diagnosis not present

## 2017-08-20 DIAGNOSIS — M25562 Pain in left knee: Secondary | ICD-10-CM | POA: Diagnosis not present

## 2017-08-20 DIAGNOSIS — M25662 Stiffness of left knee, not elsewhere classified: Secondary | ICD-10-CM | POA: Diagnosis not present

## 2017-08-20 DIAGNOSIS — R2242 Localized swelling, mass and lump, left lower limb: Secondary | ICD-10-CM

## 2017-08-20 NOTE — Therapy (Signed)
[facility] [place] [facility] [address] [place], [place], [address] Phone: [phone]   Fax:  [phone]  Physical Therapy Treatment  Patient Details  Name: Curtis Deleon MRN: [id number] Date of Birth: 1991-09-07 Referring Provider: Thurston Hole  Encounter Date: 08/20/2017      PT End of Session - 08/20/17 0901    Visit Number 2   Date for PT Re-Evaluation 10/17/17   PT Start Time 0823   PT Stop Time 0930   PT Time Calculation (min) 67 min   Activity Tolerance Patient tolerated treatment well   Behavior During Therapy [facility] for tasks assessed/performed      Past Medical History:  Diagnosis Date  . Acute medial meniscus tear of left knee   . Tears of meniscus and ACL of left knee, subsequent encounter     Past Surgical History:  Procedure Laterality Date  . KNEE ARTHROSCOPY WITH ANTERIOR CRUCIATE LIGAMENT (ACL) REPAIR WITH HAMSTRING GRAFT Left 08/07/2017   Procedure: LEFT KNEE ARTHROSCOPY WITH ANTERIOR CRUCIATE LIGAMENT (ACL) REPAIR WITH HAMSTRING GRAFT;  Surgeon: Salvatore Marvel, MD;  Location: [facility];  Service: Orthopedics;  Laterality: Left;  . KNEE ARTHROSCOPY WITH MEDIAL MENISECTOMY Left 08/07/2017   Procedure: LEFT KNEE ARTHROSCOPY WITH MEDIAL MENISECTOMY;  Surgeon: Salvatore Marvel, MD;  Location: [facility];  Service: Orthopedics;  Laterality: Left;  . NO PAST SURGERIES      There were no vitals filed for this visit.      Subjective Assessment - 08/20/17 0826    Subjective Patient reports very stiff this morning.   Currently in Pain? Yes   Pain Score 3    Pain Location Knee   Pain Orientation Left   Pain Descriptors / Indicators Sore                         [facility] Adult PT Treatment/Exercise - 08/20/17 0001      Knee/Hip Exercises: Stretches   Gastroc Stretch 3 reps;30 seconds     Knee/Hip Exercises: Aerobic   Nustep level 3 x 6 minutes     Knee/Hip Exercises:  Standing   Knee Flexion 20 reps   Knee Flexion Limitations with bent knee and with holding quad set for SLR   Hip Flexion 20 reps   Terminal Knee Extension Limitations 20 reps   Hip Abduction 20 reps   Hip Extension 20 reps     Knee/Hip Exercises: Supine   Quad Sets 20 reps   Short Arc Quad Sets 20 reps   Other Supine Knee/Hip Exercises feet on ball K2C, and bridges     Modalities   Modalities Vasopneumatic     Vasopneumatic   Number Minutes Vasopneumatic  15 minutes   Vasopnuematic Location  Knee   Vasopneumatic Pressure Medium   Vasopneumatic Temperature  34                  PT Short Term Goals - 08/17/17 1047      PT SHORT TERM GOAL #1   Title independent with initial HEP   Time 1   Period Weeks   Status New           PT Long Term Goals - 08/17/17 1047      PT LONG TERM GOAL #1   Title decrease pain 50%   Time 12   Period Weeks   Status New     PT LONG TERM GOAL #2  Title increase AROM of the left knee to WFL's   Time 12   Period Weeks   Status New     PT LONG TERM GOAL #3   Title go up and down stairs reciprocally   Time 12   Period Weeks   Status New     PT LONG TERM GOAL #4   Title increase strength of the left knee to WNL's   Time 12   Period Weeks   Status New     PT LONG TERM GOAL #5   Title walk all distances without deviation and without device   Time 12   Period Weeks   Status New               Plan - 08/20/17 0902    Clinical Impression Statement Patient doing very well, able to have good quad and VMO contraction.  Very stiff at first   PT Next Visit Plan slowly progress ROM, follow ACL protocol   Consulted and Agree with Plan of Care Patient      Patient will benefit from skilled therapeutic intervention in order to improve the following deficits and impairments:  Abnormal gait, Decreased activity tolerance, Decreased balance, Decreased mobility, Decreased strength, Increased edema, Pain, Decreased range of  motion, Difficulty walking  Visit Diagnosis: Acute pain of left knee  Stiffness of left knee, not elsewhere classified  Difficulty in walking, not elsewhere classified  Localized swelling, mass and lump, left lower limb     Problem List Patient Active Problem List   Diagnosis Date Noted  . Tears of meniscus and ACL of left knee, subsequent encounter   . Acute medial meniscus tear of left knee     Jearld LeschALBRIGHT,Jeffre Enriques W., PT 08/20/2017, 9:04 AM  [facility] [place] [facility] [address] [place], [place], [address] Phone: [phone]   Fax:  [phone]  Name: Curtis Deleon MRN: [id number] Date of Birth: 10/08/1991

## 2017-08-21 DIAGNOSIS — S83282D Other tear of lateral meniscus, current injury, left knee, subsequent encounter: Secondary | ICD-10-CM | POA: Diagnosis not present

## 2017-08-23 ENCOUNTER — Encounter: Payer: Self-pay | Admitting: Physical Therapy

## 2017-08-23 ENCOUNTER — Ambulatory Visit: Payer: 59 | Admitting: Physical Therapy

## 2017-08-23 DIAGNOSIS — M25562 Pain in left knee: Secondary | ICD-10-CM | POA: Diagnosis not present

## 2017-08-23 DIAGNOSIS — R262 Difficulty in walking, not elsewhere classified: Secondary | ICD-10-CM | POA: Diagnosis not present

## 2017-08-23 DIAGNOSIS — M25662 Stiffness of left knee, not elsewhere classified: Secondary | ICD-10-CM

## 2017-08-23 DIAGNOSIS — R2242 Localized swelling, mass and lump, left lower limb: Secondary | ICD-10-CM | POA: Diagnosis not present

## 2017-08-23 NOTE — Therapy (Signed)
[place], [place], [address] Phone: [phone]   Fax:  [phone]  Physical Therapy Treatment  Patient Details  Name: Marcquis Ridlon MRN: [id number] Date of Birth: [birth year] Referring Provider: Noemi Chapel  Encounter Date: 08/23/2017      PT End of Session - 08/23/17 0917    Visit Number 3   Date for PT Re-Evaluation 10/17/17   PT Start Time 0850   PT Stop Time 0945   PT Time Calculation (min) 55 min      Past Medical History:  Diagnosis Date  . Acute medial meniscus tear of left knee   . Tears of meniscus and ACL of left knee, subsequent encounter     Past Surgical History:  Procedure Laterality Date  . KNEE ARTHROSCOPY WITH ANTERIOR CRUCIATE LIGAMENT (ACL) REPAIR WITH HAMSTRING GRAFT Left 08/07/2017   Procedure: LEFT KNEE ARTHROSCOPY WITH ANTERIOR CRUCIATE LIGAMENT (ACL) REPAIR WITH HAMSTRING GRAFT;  Surgeon: Elsie Saas, MD;  Location: [place];  Service: Orthopedics;  Laterality: Left;  . KNEE ARTHROSCOPY WITH MEDIAL MENISECTOMY Left 08/07/2017   Procedure: LEFT KNEE ARTHROSCOPY WITH MEDIAL MENISECTOMY;  Surgeon: Elsie Saas, MD;  Location: [place];  Service: Orthopedics;  Laterality: Left;  . NO PAST SURGERIES      There were no vitals filed for this visit.      Subjective Assessment - 08/23/17 0852    Subjective sgetting comfortable to sleep is hard   Currently in Pain? Yes   Pain Score 2    Pain Location Knee   Pain Orientation Left            [facility] PT Assessment - 08/23/17 0001      AROM   Left Knee Extension 8   Left Knee Flexion 105                     [facility] Adult PT Treatment/Exercise - 08/23/17 0001      Knee/Hip Exercises: Aerobic   Nustep level 3 x 8 minutes     Knee/Hip Exercises: Seated   Long Arc Quad Strengthening;Left;2 sets;10 reps  EOB     Knee/Hip Exercises: Supine   Short Arc Quad Sets  Strengthening;Left;15 reps  on ball with 3 sec hold   Heel Slides AROM;Left;20 reps   Terminal Knee Extension Strengthening;Left;15 reps;Theraband   Theraband Level (Terminal Knee Extension) Level 1 (Yellow)   Straight Leg Raises Strengthening;Left;15 reps  with quad set   Other Supine Knee/Hip Exercises feet on ball K2C, and bridges     Modalities   Modalities Vasopneumatic     Vasopneumatic   Number Minutes Vasopneumatic  15 minutes   Vasopnuematic Location  Knee   Vasopneumatic Pressure Medium   Vasopneumatic Temperature  34                  PT Short Term Goals - 08/23/17 4656      PT SHORT TERM GOAL #1   Title independent with initial HEP   Status Achieved           PT Long Term Goals - 08/17/17 1047      PT LONG TERM GOAL #1   Title decrease pain 50%   Time 12   Period Weeks   Status New     PT LONG TERM GOAL #2   Title increase AROM of the left knee to WFL's   Time 12   Period Weeks  Status New     PT LONG TERM GOAL #3   Title go up and down stairs reciprocally   Time 12   Period Weeks   Status New     PT LONG TERM GOAL #4   Title increase strength of the left knee to WNL's   Time 12   Period Weeks   Status New     PT LONG TERM GOAL #5   Title walk all distances without deviation and without device   Time 12   Period Weeks   Status New               Plan - 08/23/17 1751    Clinical Impression Statement pt doing very well with good quad activation. cautioned pt about over doing. STG met. Good increase in ROM.   PT Treatment/Interventions Cryotherapy;Electrical Stimulation;Functional mobility training;Stair training;Gait training;Therapeutic activities;Therapeutic exercise;Balance training;Neuromuscular re-education;Patient/family education;Manual techniques;Vasopneumatic Device   PT Next Visit Plan slowly progress ROM, follow ACL protocol      Patient will benefit from skilled therapeutic intervention in order to improve  the following deficits and impairments:  Abnormal gait, Decreased activity tolerance, Decreased balance, Decreased mobility, Decreased strength, Increased edema, Pain, Decreased range of motion, Difficulty walking  Visit Diagnosis: Acute pain of left knee  Stiffness of left knee, not elsewhere classified  Difficulty in walking, not elsewhere classified  Localized swelling, mass and lump, left lower limb     Problem List Patient Active Problem List   Diagnosis Date Noted  . Tears of meniscus and ACL of left knee, subsequent encounter   . Acute medial meniscus tear of left knee     PAYSEUR,ANGIE PTA 08/23/2017, 9:24 AM  [place], [place], [address] Phone: [phone]   Fax:  [phone]  Name: Davell Beckstead MRN: [id number] Date of Birth: [birth year]

## 2017-08-27 ENCOUNTER — Encounter: Payer: Self-pay | Admitting: Physical Therapy

## 2017-08-27 ENCOUNTER — Ambulatory Visit: Payer: 59 | Admitting: Physical Therapy

## 2017-08-27 DIAGNOSIS — M25562 Pain in left knee: Secondary | ICD-10-CM | POA: Diagnosis not present

## 2017-08-27 DIAGNOSIS — R262 Difficulty in walking, not elsewhere classified: Secondary | ICD-10-CM | POA: Diagnosis not present

## 2017-08-27 DIAGNOSIS — M25662 Stiffness of left knee, not elsewhere classified: Secondary | ICD-10-CM

## 2017-08-27 DIAGNOSIS — R2242 Localized swelling, mass and lump, left lower limb: Secondary | ICD-10-CM | POA: Diagnosis not present

## 2017-08-27 NOTE — Therapy (Signed)
[facility] [place] [address] [place], [place], [address] Phone: [phone]   Fax:  [phone]  Physical Therapy Treatment  Patient Details  Name: Curtis Deleon MRN: [id number] Date of Birth: 1990-12-26 Referring Provider: Thurston Hole  Encounter Date: 08/27/2017      PT End of Session - 08/27/17 0929    Visit Number 4   Date for PT Re-Evaluation 10/17/17   PT Start Time 0835   PT Stop Time 0940   PT Time Calculation (min) 65 min   Activity Tolerance Patient tolerated treatment well   Behavior During Therapy [facility] for tasks assessed/performed      Past Medical History:  Diagnosis Date  . Acute medial meniscus tear of left knee   . Tears of meniscus and ACL of left knee, subsequent encounter     Past Surgical History:  Procedure Laterality Date  . KNEE ARTHROSCOPY WITH ANTERIOR CRUCIATE LIGAMENT (ACL) REPAIR WITH HAMSTRING GRAFT Left 08/07/2017   Procedure: LEFT KNEE ARTHROSCOPY WITH ANTERIOR CRUCIATE LIGAMENT (ACL) REPAIR WITH HAMSTRING GRAFT;  Surgeon: Salvatore Marvel, MD;  Location: [facility];  Service: Orthopedics;  Laterality: Left;  . KNEE ARTHROSCOPY WITH MEDIAL MENISECTOMY Left 08/07/2017   Procedure: LEFT KNEE ARTHROSCOPY WITH MEDIAL MENISECTOMY;  Surgeon: Salvatore Marvel, MD;  Location: [facility];  Service: Orthopedics;  Laterality: Left;  . NO PAST SURGERIES      There were no vitals filed for this visit.      Subjective Assessment - 08/27/17 0854    Subjective Feeling okay, the front of the knee is sore and swollen    Currently in Pain? Yes   Pain Score 3    Pain Location Knee   Pain Orientation Left;Anterior                         [facility] Adult PT Treatment/Exercise - 08/27/17 0001      High Level Balance   High Level Balance Comments on airex ball toss, NBOS with eyes closed, and then head turns     Knee/Hip Exercises: Stretches   Gastroc Stretch 3  reps;30 seconds     Knee/Hip Exercises: Aerobic   Recumbent Bike x 6 minutes full revolutions   Nustep level 3 x 8 minutes     Knee/Hip Exercises: Machines for Strengthening   Cybex Leg Press 20# 2x10. then left only 3 x 5 reps     Knee/Hip Exercises: Standing   Forward Step Up Step Height: 4";20 reps   Step Down Step Height: 4";20 reps   Walking with Sports Cord front and back     Knee/Hip Exercises: Supine   Straight Leg Raises Strengthening;Left;15 reps   Other Supine Knee/Hip Exercises feet on ball K2C, and bridges     Modalities   Modalities Vasopneumatic     Vasopneumatic   Number Minutes Vasopneumatic  15 minutes   Vasopnuematic Location  Knee   Vasopneumatic Pressure Medium   Vasopneumatic Temperature  34                  PT Short Term Goals - 08/23/17 6962      PT SHORT TERM GOAL #1   Title independent with initial HEP   Status Achieved           PT Long Term Goals - 08/17/17 1047      PT LONG TERM GOAL #1   Title decrease pain 50%   Time  12   Period Weeks   Status New     PT LONG TERM GOAL #2   Title increase AROM of the left knee to WFL's   Time 12   Period Weeks   Status New     PT LONG TERM GOAL #3   Title go up and down stairs reciprocally   Time 12   Period Weeks   Status New     PT LONG TERM GOAL #4   Title increase strength of the left knee to WNL's   Time 12   Period Weeks   Status New     PT LONG TERM GOAL #5   Title walk all distances without deviation and without device   Time 12   Period Weeks   Status New               Plan - 08/27/17 0930    Clinical Impression Statement Patient timid today with activities, especially with stepping up and down 4" step.  Still walking stiff legged.   PT Next Visit Plan work on gait, follow ACL protocol   Consulted and Agree with Plan of Care Patient      Patient will benefit from skilled therapeutic intervention in order to improve the following deficits and  impairments:  Abnormal gait, Decreased activity tolerance, Decreased balance, Decreased mobility, Decreased strength, Increased edema, Pain, Decreased range of motion, Difficulty walking  Visit Diagnosis: Acute pain of left knee  Stiffness of left knee, not elsewhere classified  Difficulty in walking, not elsewhere classified  Localized swelling, mass and lump, left lower limb     Problem List Patient Active Problem List   Diagnosis Date Noted  . Tears of meniscus and ACL of left knee, subsequent encounter   . Acute medial meniscus tear of left knee     Jearld Lesch., PT 08/27/2017, 9:32 AM  [facility] [place] [address] [place], [place], [address] Phone: [phone]   Fax:  [phone]  Name: Curtis Deleon MRN: [id number] Date of Birth: 01/31/1991

## 2017-08-29 ENCOUNTER — Ambulatory Visit: Payer: 59 | Admitting: Physical Therapy

## 2017-09-03 ENCOUNTER — Ambulatory Visit: Payer: 59 | Admitting: Physical Therapy

## 2017-09-03 ENCOUNTER — Encounter: Payer: Self-pay | Admitting: Physical Therapy

## 2017-09-03 DIAGNOSIS — M25662 Stiffness of left knee, not elsewhere classified: Secondary | ICD-10-CM

## 2017-09-03 DIAGNOSIS — R2242 Localized swelling, mass and lump, left lower limb: Secondary | ICD-10-CM

## 2017-09-03 DIAGNOSIS — R262 Difficulty in walking, not elsewhere classified: Secondary | ICD-10-CM

## 2017-09-03 DIAGNOSIS — M25562 Pain in left knee: Secondary | ICD-10-CM

## 2017-09-03 NOTE — Therapy (Signed)
[place], [place], [address] Phone: [phone]   Fax:  [phone]  Physical Therapy Treatment  Patient Details  Name: Curtis Deleon MRN: [id number] Date of Birth: 1991-06-23 Referring Provider: Noemi Chapel  Encounter Date: 09/03/2017      PT End of Session - 09/03/17 2778    Visit Number 5   Date for PT Re-Evaluation 10/17/17   PT Start Time 0900   PT Stop Time 0945   PT Time Calculation (min) 45 min   Activity Tolerance Patient tolerated treatment well   Behavior During Therapy [facility] for tasks assessed/performed      Past Medical History:  Diagnosis Date  . Acute medial meniscus tear of left knee   . Tears of meniscus and ACL of left knee, subsequent encounter     Past Surgical History:  Procedure Laterality Date  . KNEE ARTHROSCOPY WITH ANTERIOR CRUCIATE LIGAMENT (ACL) REPAIR WITH HAMSTRING GRAFT Left 08/07/2017   Procedure: LEFT KNEE ARTHROSCOPY WITH ANTERIOR CRUCIATE LIGAMENT (ACL) REPAIR WITH HAMSTRING GRAFT;  Surgeon: Elsie Saas, MD;  Location: [facility];  Service: Orthopedics;  Laterality: Left;  . KNEE ARTHROSCOPY WITH MEDIAL MENISECTOMY Left 08/07/2017   Procedure: LEFT KNEE ARTHROSCOPY WITH MEDIAL MENISECTOMY;  Surgeon: Elsie Saas, MD;  Location: [facility];  Service: Orthopedics;  Laterality: Left;  . NO PAST SURGERIES      There were no vitals filed for this visit.      Subjective Assessment - 09/03/17 0900    Subjective C/o stiffness today, reports over the weekend he noticed going from flexion to extension he would get a popping   Currently in Pain? Yes   Pain Score 3    Pain Location Knee   Pain Orientation Left;Anterior   Pain Descriptors / Indicators Tightness                         [facility] Adult PT Treatment/Exercise - 09/03/17 0001      Knee/Hip Exercises: Aerobic   Elliptical R=5 I=7 x 5 minutes    Recumbent Bike level 1 x 6 minutes     Knee/Hip Exercises: Machines for Strengthening   Cybex Knee Flexion 20# 3x10   Cybex Leg Press 20# 2x10. then left only 3 x 5 reps     Knee/Hip Exercises: Standing   Terminal Knee Extension Limitations 20 reps   Hip Abduction 20 reps   Hip Extension 20 reps   Forward Step Up Step Height: 4";20 reps   Step Down Step Height: 4";20 reps   Walking with Sports Cord front and back   Other Standing Knee Exercises practiced gait with bending knee and nice marching     Knee/Hip Exercises: Seated   Long Arc Quad Strengthening;Left;2 sets;10 reps   Long Arc Quad Weight 2 lbs.     Knee/Hip Exercises: Supine   Straight Leg Raises Strengthening;Left;15 reps   Other Supine Knee/Hip Exercises feet on ball K2C, and bridges                  PT Short Term Goals - 08/23/17 0918      PT SHORT TERM GOAL #1   Title independent with initial HEP   Status Achieved           PT Long Term Goals - 09/03/17 1518      PT LONG TERM GOAL #1   Title decrease pain 50%   Status  On-going     PT LONG TERM GOAL #2   Title increase AROM of the left knee to WFL's   Status On-going     PT LONG TERM GOAL #3   Title go up and down stairs reciprocally   Status Partially Met               Plan - 09/03/17 1517    Clinical Impression Statement He was still stiff legged with walking and did not move easily, he was again timid with some things.  He denies a lot of pain just tightness over the front of the knee   PT Next Visit Plan work on gait, follow ACL protocol   Consulted and Agree with Plan of Care Patient      Patient will benefit from skilled therapeutic intervention in order to improve the following deficits and impairments:  Abnormal gait, Decreased activity tolerance, Decreased balance, Decreased mobility, Decreased strength, Increased edema, Pain, Decreased range of motion, Difficulty walking  Visit Diagnosis: Acute pain of left  knee  Stiffness of left knee, not elsewhere classified  Difficulty in walking, not elsewhere classified  Localized swelling, mass and lump, left lower limb     Problem List Patient Active Problem List   Diagnosis Date Noted  . Tears of meniscus and ACL of left knee, subsequent encounter   . Acute medial meniscus tear of left knee     Sumner Boast., PT 09/03/2017, 3:18 PM  [address] [address] [place], [place], [address] Phone: [phone]   Fax:  [phone]  Name: Curtis Deleon MRN: [id number] Date of Birth: 06/18/1991

## 2017-09-05 ENCOUNTER — Encounter: Payer: Self-pay | Admitting: Physical Therapy

## 2017-09-05 ENCOUNTER — Ambulatory Visit: Payer: 59 | Admitting: Physical Therapy

## 2017-09-05 DIAGNOSIS — M25662 Stiffness of left knee, not elsewhere classified: Secondary | ICD-10-CM | POA: Diagnosis not present

## 2017-09-05 DIAGNOSIS — M25562 Pain in left knee: Secondary | ICD-10-CM

## 2017-09-05 DIAGNOSIS — R2242 Localized swelling, mass and lump, left lower limb: Secondary | ICD-10-CM | POA: Diagnosis not present

## 2017-09-05 DIAGNOSIS — R262 Difficulty in walking, not elsewhere classified: Secondary | ICD-10-CM | POA: Diagnosis not present

## 2017-09-05 NOTE — Therapy (Signed)
[place], [place], [address] Phone: [phone]   Fax:  [phone]  Physical Therapy Treatment  Patient Details  Name: Curtis Deleon MRN: [id number] Date of Birth: [birth year] Referring Provider: Noemi Chapel  Encounter Date: 09/05/2017      PT End of Session - 09/05/17 0921    Visit Number 6   Date for PT Re-Evaluation 10/17/17   PT Start Time 0847   PT Stop Time 0933   PT Time Calculation (min) 46 min   Activity Tolerance Patient tolerated treatment well   Behavior During Therapy [facility] for tasks assessed/performed      Past Medical History:  Diagnosis Date  . Acute medial meniscus tear of left knee   . Tears of meniscus and ACL of left knee, subsequent encounter     Past Surgical History:  Procedure Laterality Date  . KNEE ARTHROSCOPY WITH ANTERIOR CRUCIATE LIGAMENT (ACL) REPAIR WITH HAMSTRING GRAFT Left 08/07/2017   Procedure: LEFT KNEE ARTHROSCOPY WITH ANTERIOR CRUCIATE LIGAMENT (ACL) REPAIR WITH HAMSTRING GRAFT;  Surgeon: Elsie Saas, MD;  Location: [facility];  Service: Orthopedics;  Laterality: Left;  . KNEE ARTHROSCOPY WITH MEDIAL MENISECTOMY Left 08/07/2017   Procedure: LEFT KNEE ARTHROSCOPY WITH MEDIAL MENISECTOMY;  Surgeon: Elsie Saas, MD;  Location: [facility];  Service: Orthopedics;  Laterality: Left;  . NO PAST SURGERIES      There were no vitals filed for this visit.      Subjective Assessment - 09/05/17 0847    Subjective continues to have stiffness and "locking" reports this after long periods of sitting   Currently in Pain? Yes   Pain Score 2    Pain Location Knee   Pain Orientation Left   Aggravating Factors  sitting long periods            [facility] PT Assessment - 09/05/17 0001      AROM   Left Knee Extension 6   Left Knee Flexion 108                     [facility] Adult PT Treatment/Exercise - 09/05/17 0001      Ambulation/Gait   Gait Comments stairs step over step up and down     High Level Balance   High Level Balance Activities Side stepping   High Level Balance Comments on airex ball toss, NBOS with eyes closed, and then head turns, resisted gait fwd and backward     Knee/Hip Exercises: Aerobic   Recumbent Bike level 1 x 6 minutes     Knee/Hip Exercises: Machines for Strengthening   Cybex Knee Extension 5# 3x10, small motion about 45 degrees then to full extension, had to switch to eccentrics   Cybex Knee Flexion 20# 3x10   Cybex Leg Press 40# 2x10. then left only 3 x 5 reps 20#     Knee/Hip Exercises: Standing   Walking with Sports Cord front and back   Other Standing Knee Exercises SLS 5# deadlift     Knee/Hip Exercises: Supine   Short Arc Quad Sets Limitations with Russian stim 4 seconds on/12 off x 10 minutes                  PT Short Term Goals - 08/23/17 5087      PT SHORT TERM GOAL #1   Title independent with initial HEP   Status Achieved  PT Long Term Goals - 09/05/17 0922      PT LONG TERM GOAL #1   Title decrease pain 50%   Status Partially Met               Plan - 09/05/17 0921    Clinical Impression Statement Has a quad lag, decreased TKE, used [place] stim to help today.  He is a little timid with activities   PT Next Visit Plan work on gait, follow ACL protocol   Consulted and Agree with Plan of Care Patient      Patient will benefit from skilled therapeutic intervention in order to improve the following deficits and impairments:  Abnormal gait, Decreased activity tolerance, Decreased balance, Decreased mobility, Decreased strength, Increased edema, Pain, Decreased range of motion, Difficulty walking  Visit Diagnosis: Acute pain of left knee  Stiffness of left knee, not elsewhere classified  Difficulty in walking, not elsewhere classified     Problem List Patient Active Problem List   Diagnosis Date Noted  . Tears of  meniscus and ACL of left knee, subsequent encounter   . Acute medial meniscus tear of left knee     Sumner Boast., PT 09/05/2017, 9:23 AM  [place] [address] [place], [place], [address] Phone: [phone]   Fax:  [phone]  Name: Marios Gaiser MRN: [id number] Date of Birth: [birth year]

## 2017-09-10 ENCOUNTER — Encounter: Payer: Self-pay | Admitting: Physical Therapy

## 2017-09-10 ENCOUNTER — Ambulatory Visit: Payer: 59 | Attending: Orthopedic Surgery | Admitting: Physical Therapy

## 2017-09-10 DIAGNOSIS — M25662 Stiffness of left knee, not elsewhere classified: Secondary | ICD-10-CM | POA: Diagnosis not present

## 2017-09-10 DIAGNOSIS — M25562 Pain in left knee: Secondary | ICD-10-CM | POA: Diagnosis not present

## 2017-09-10 DIAGNOSIS — R262 Difficulty in walking, not elsewhere classified: Secondary | ICD-10-CM | POA: Diagnosis not present

## 2017-09-10 DIAGNOSIS — R2242 Localized swelling, mass and lump, left lower limb: Secondary | ICD-10-CM | POA: Diagnosis not present

## 2017-09-10 NOTE — Therapy (Signed)
[place], [place], [address] Phone: [phone]   Fax:  [phone]  Physical Therapy Treatment  Patient Details  Name: Curtis Deleon MRN: [id number] Date of Birth: 1991-03-20 Referring Provider: Noemi Chapel  Encounter Date: 09/10/2017      PT End of Session - 09/10/17 0930    Visit Number 7   Date for PT Re-Evaluation 10/17/17   PT Start Time 0849   PT Stop Time 0935   PT Time Calculation (min) 46 min   Activity Tolerance Patient tolerated treatment well   Behavior During Therapy [facility] for tasks assessed/performed      Past Medical History:  Diagnosis Date  . Acute medial meniscus tear of left knee   . Tears of meniscus and ACL of left knee, subsequent encounter     Past Surgical History:  Procedure Laterality Date  . KNEE ARTHROSCOPY WITH ANTERIOR CRUCIATE LIGAMENT (ACL) REPAIR WITH HAMSTRING GRAFT Left 08/07/2017   Procedure: LEFT KNEE ARTHROSCOPY WITH ANTERIOR CRUCIATE LIGAMENT (ACL) REPAIR WITH HAMSTRING GRAFT;  Surgeon: Elsie Saas, MD;  Location: [facility];  Service: Orthopedics;  Laterality: Left;  . KNEE ARTHROSCOPY WITH MEDIAL MENISECTOMY Left 08/07/2017   Procedure: LEFT KNEE ARTHROSCOPY WITH MEDIAL MENISECTOMY;  Surgeon: Elsie Saas, MD;  Location: [facility];  Service: Orthopedics;  Laterality: Left;  . NO PAST SURGERIES      There were no vitals filed for this visit.      Subjective Assessment - 09/10/17 0852    Subjective Patient reports that he was on his feet more over the weekend, reports some pain in the patellar tendon, reports "I need to ice more"   Currently in Pain? Yes   Pain Score 2    Pain Location Knee   Pain Orientation Left                         [facility] Adult PT Treatment/Exercise - 09/10/17 0001      High Level Balance   High Level Balance Comments on Bosu ball tossing, airex single leg stance ball  toss     Knee/Hip Exercises: Aerobic   Elliptical R=5 I=7 x 5 minutes   Recumbent Bike level 2 x 6 minutes     Knee/Hip Exercises: Machines for Strengthening   Cybex Knee Extension 5# 3x10, small motion about 45 degrees then to full extension, had to switch to eccentrics   Cybex Knee Flexion 20# 3x10   Cybex Leg Press 40# 2x10. then left only 3 x 5 reps 20#     Knee/Hip Exercises: Standing   Terminal Knee Extension Limitations 20 reps ball behind knee   Stairs up and down step over step   Walking with Sports Cord front and back   Other Standing Knee Exercises SLS 5# deadlift   Other Standing Knee Exercises practiced gait with bending knee and nice marching                  PT Short Term Goals - 08/23/17 0918      PT SHORT TERM GOAL #1   Title independent with initial HEP   Status Achieved           PT Long Term Goals - 09/10/17 0931      PT LONG TERM GOAL #1   Title decrease pain 50%   Status Partially Met     PT LONG TERM GOAL #3  Title go up and down stairs reciprocally   Status Achieved               Plan - 09/10/17 0931    Clinical Impression Statement Did better today, less c/o catching, better walk   PT Next Visit Plan work on gait, follow ACL protocol   Consulted and Agree with Plan of Care Patient      Patient will benefit from skilled therapeutic intervention in order to improve the following deficits and impairments:  Abnormal gait, Decreased activity tolerance, Decreased balance, Decreased mobility, Decreased strength, Increased edema, Pain, Decreased range of motion, Difficulty walking  Visit Diagnosis: Acute pain of left knee  Stiffness of left knee, not elsewhere classified  Difficulty in walking, not elsewhere classified     Problem List Patient Active Problem List   Diagnosis Date Noted  . Tears of meniscus and ACL of left knee, subsequent encounter   . Acute medial meniscus tear of left knee     Reshma Hoey  W.,PT 09/10/2017, 9:32 AM  [place] [address] [place], [place], [address] Phone: [phone]   Fax:  [phone]  Name: Curtis Deleon MRN: [id number] Date of Birth: Curtis 14, 1992

## 2017-09-12 ENCOUNTER — Ambulatory Visit: Payer: 59 | Admitting: Physical Therapy

## 2017-09-18 DIAGNOSIS — S83512D Sprain of anterior cruciate ligament of left knee, subsequent encounter: Secondary | ICD-10-CM | POA: Diagnosis not present

## 2017-09-18 DIAGNOSIS — S83282D Other tear of lateral meniscus, current injury, left knee, subsequent encounter: Secondary | ICD-10-CM | POA: Diagnosis not present

## 2017-10-03 ENCOUNTER — Ambulatory Visit: Payer: 59 | Admitting: Physical Therapy

## 2017-10-03 ENCOUNTER — Encounter: Payer: Self-pay | Admitting: Physical Therapy

## 2017-10-03 DIAGNOSIS — M25662 Stiffness of left knee, not elsewhere classified: Secondary | ICD-10-CM | POA: Diagnosis not present

## 2017-10-03 DIAGNOSIS — M25562 Pain in left knee: Secondary | ICD-10-CM

## 2017-10-03 DIAGNOSIS — R2242 Localized swelling, mass and lump, left lower limb: Secondary | ICD-10-CM | POA: Diagnosis not present

## 2017-10-03 DIAGNOSIS — R262 Difficulty in walking, not elsewhere classified: Secondary | ICD-10-CM | POA: Diagnosis not present

## 2017-10-03 NOTE — Therapy (Signed)
[place], [place], [address] Phone: [phone]   Fax:  [phone]  Physical Therapy Treatment  Patient Details  Name: Curtis Deleon MRN: [id number] Date of Birth: 03/30/1991 Referring Provider: Noemi Chapel  Encounter Date: 10/03/2017      PT End of Session - 10/03/17 1010    Visit Number 8   Date for PT Re-Evaluation 10/17/17   PT Start Time 0939   PT Stop Time 1015   PT Time Calculation (min) 36 min   Activity Tolerance Patient tolerated treatment well   Behavior During Therapy [facility] for tasks assessed/performed      Past Medical History:  Diagnosis Date  . Acute medial meniscus tear of left knee   . Tears of meniscus and ACL of left knee, subsequent encounter     Past Surgical History:  Procedure Laterality Date  . KNEE ARTHROSCOPY WITH ANTERIOR CRUCIATE LIGAMENT (ACL) REPAIR WITH HAMSTRING GRAFT Left 08/07/2017   Procedure: LEFT KNEE ARTHROSCOPY WITH ANTERIOR CRUCIATE LIGAMENT (ACL) REPAIR WITH HAMSTRING GRAFT;  Surgeon: Elsie Saas, MD;  Location: [facility];  Service: Orthopedics;  Laterality: Left;  . KNEE ARTHROSCOPY WITH MEDIAL MENISECTOMY Left 08/07/2017   Procedure: LEFT KNEE ARTHROSCOPY WITH MEDIAL MENISECTOMY;  Surgeon: Elsie Saas, MD;  Location: [facility];  Service: Orthopedics;  Laterality: Left;  . NO PAST SURGERIES      There were no vitals filed for this visit.      Subjective Assessment - 10/03/17 0941    Subjective Pt reports that he has been out of sync with things, reports going back and forth with between [place] and [place].    Currently in Pain? No/denies   Pain Score 0-No pain                         [facility] Adult PT Treatment/Exercise - 10/03/17 0001      Knee/Hip Exercises: Aerobic   Elliptical I 10 R 5 42fd/2rev   Recumbent Bike level 2 x 6 minutes     Knee/Hip Exercises: Machines for Strengthening    Cybex Knee Extension 5lb 2x10    Cybex Knee Flexion 25# 2x15; LLE 15lb 2x10    Cybex Leg Press 40# 2x10. then left only 2 x10 reps 20#                  PT Short Term Goals - 08/23/17 00272     PT SHORT TERM GOAL #1   Title independent with initial HEP   Status Achieved           PT Long Term Goals - 09/10/17 0931      PT LONG TERM GOAL #1   Title decrease pain 50%   Status Partially Met     PT LONG TERM GOAL #3   Title go up and down stairs reciprocally   Status Achieved               Plan - 10/03/17 1010    Clinical Impression Statement PT ~ 10 minutes late for today's treatment session. Pt returns to therapy after 3 weeks, visible L quat weakness and atrophy. Visible L quad shaking with extensions. Does not appear to reach TKE with the L knee on leg press.   Rehab Potential Good   PT Frequency 2x / week   PT Duration 12 weeks   PT Treatment/Interventions Cryotherapy;Electrical Stimulation;Functional mobility training;Stair training;Gait training;Therapeutic activities;Therapeutic  exercise;Balance training;Neuromuscular re-education;Patient/family education;Manual techniques;Vasopneumatic Device   PT Next Visit Plan work on gait, follow ACL protocol      Patient will benefit from skilled therapeutic intervention in order to improve the following deficits and impairments:  Abnormal gait, Decreased activity tolerance, Decreased balance, Decreased mobility, Decreased strength, Increased edema, Pain, Decreased range of motion, Difficulty walking  Visit Diagnosis: Acute pain of left knee  Stiffness of left knee, not elsewhere classified  Localized swelling, mass and lump, left lower limb  Difficulty in walking, not elsewhere classified     Problem List Patient Active Problem List   Diagnosis Date Noted  . Tears of meniscus and ACL of left knee, subsequent encounter   . Acute medial meniscus tear of left knee     Curtis Deleon 10/03/2017, 10:13 AM  [address] [place], [place], [address] Phone: [phone]   Fax:  [phone]  Name: Curtis Deleon MRN: [id number] Date of Birth: Mar 29, 1991

## 2017-10-05 ENCOUNTER — Ambulatory Visit: Payer: 59 | Admitting: Physical Therapy

## 2017-10-05 ENCOUNTER — Encounter: Payer: Self-pay | Admitting: Physical Therapy

## 2017-10-05 DIAGNOSIS — M25562 Pain in left knee: Secondary | ICD-10-CM | POA: Diagnosis not present

## 2017-10-05 DIAGNOSIS — M25662 Stiffness of left knee, not elsewhere classified: Secondary | ICD-10-CM | POA: Diagnosis not present

## 2017-10-05 DIAGNOSIS — R2242 Localized swelling, mass and lump, left lower limb: Secondary | ICD-10-CM | POA: Diagnosis not present

## 2017-10-05 DIAGNOSIS — R262 Difficulty in walking, not elsewhere classified: Secondary | ICD-10-CM | POA: Diagnosis not present

## 2017-10-05 NOTE — Therapy (Signed)
[place], [place], [address] Phone: [phone]   Fax:  [phone]  Physical Therapy Treatment  Patient Details  Name: Curtis Deleon MRN: [id number] Date of Birth: Apr 25, 1991 Referring Provider: Noemi Chapel  Encounter Date: 10/05/2017      PT End of Session - 10/05/17 0904    Visit Number 9   Date for PT Re-Evaluation 10/17/17   PT Start Time 4196   PT Stop Time 0935   PT Time Calculation (min) 40 min      Past Medical History:  Diagnosis Date  . Acute medial meniscus tear of left knee   . Tears of meniscus and ACL of left knee, subsequent encounter     Past Surgical History:  Procedure Laterality Date  . KNEE ARTHROSCOPY WITH ANTERIOR CRUCIATE LIGAMENT (ACL) REPAIR WITH HAMSTRING GRAFT Left 08/07/2017   Procedure: LEFT KNEE ARTHROSCOPY WITH ANTERIOR CRUCIATE LIGAMENT (ACL) REPAIR WITH HAMSTRING GRAFT;  Surgeon: Elsie Saas, MD;  Location: [facility];  Service: Orthopedics;  Laterality: Left;  . KNEE ARTHROSCOPY WITH MEDIAL MENISECTOMY Left 08/07/2017   Procedure: LEFT KNEE ARTHROSCOPY WITH MEDIAL MENISECTOMY;  Surgeon: Elsie Saas, MD;  Location: [facility];  Service: Orthopedics;  Laterality: Left;  . NO PAST SURGERIES      There were no vitals filed for this visit.      Subjective Assessment - 10/05/17 0857    Subjective 10 min late. "okay"   Currently in Pain? Yes   Pain Score 2    Pain Location Knee   Pain Orientation Left            [facility] PT Assessment - 10/05/17 0001      AROM   Left Knee Extension 2   Left Knee Flexion 140                     [facility] Adult PT Treatment/Exercise - 10/05/17 0001      Knee/Hip Exercises: Aerobic   Elliptical I 10 R 5 3 fwd/3 back     Knee/Hip Exercises: Machines for Strengthening   Cybex Knee Extension 5lb 2x10  left only   Cybex Knee Flexion 25# 15; LLE 20# 2x10    Cybex Leg Press  40# 2x10. then left only 2 x10 reps 20#     Knee/Hip Exercises: Standing   Lateral Step Up 15 reps;Left;Step Height: 8"   Forward Step Up Left;15 reps;Step Height: 8"   SLS with Vectors on airex     Knee/Hip Exercises: Seated   Sit to Sand 15 reps;without UE support  LLE only                  PT Short Term Goals - 08/23/17 2229      PT SHORT TERM GOAL #1   Title independent with initial HEP   Status Achieved           PT Long Term Goals - 10/05/17 0858      PT LONG TERM GOAL #1   Title decrease pain 50%   Status Partially Met     PT LONG TERM GOAL #2   Title increase AROM of the left knee to WFL's   Baseline ext -2 with quad lag   Status Partially Met     PT LONG TERM GOAL #4   Title increase strength of the left knee to WNL's   Baseline quad lag with visable shaking   Status  On-going     PT LONG TERM GOAL #5   Title walk all distances without deviation and without device   Baseline deviation with fatigue   Status Partially Met               Plan - 10/05/17 0904    Clinical Impression Statement progressing with goals. excellent ROM but still presents with quad lag and visable shaking with exercise   PT Next Visit Plan quad strength per protocol      Patient will benefit from skilled therapeutic intervention in order to improve the following deficits and impairments:     Visit Diagnosis: Acute pain of left knee  Stiffness of left knee, not elsewhere classified     Problem List Patient Active Problem List   Diagnosis Date Noted  . Tears of meniscus and ACL of left knee, subsequent encounter   . Acute medial meniscus tear of left knee     Kainoa Swoboda,ANGIE PTA 10/05/2017, 9:21 AM  [address] [place], [place], [address] Phone: [phone]   Fax:  [phone]  Name: Curtis Deleon MRN: [id number] Date of Birth: Apr 22, 1991

## 2017-10-30 DIAGNOSIS — S83282D Other tear of lateral meniscus, current injury, left knee, subsequent encounter: Secondary | ICD-10-CM | POA: Diagnosis not present

## 2017-12-20 DIAGNOSIS — S83282D Other tear of lateral meniscus, current injury, left knee, subsequent encounter: Secondary | ICD-10-CM | POA: Diagnosis not present

## 2017-12-21 ENCOUNTER — Ambulatory Visit: Payer: 59 | Attending: Orthopedic Surgery | Admitting: Physical Therapy

## 2018-01-17 DIAGNOSIS — S83282D Other tear of lateral meniscus, current injury, left knee, subsequent encounter: Secondary | ICD-10-CM | POA: Diagnosis not present

## 2018-05-21 DIAGNOSIS — H52223 Regular astigmatism, bilateral: Secondary | ICD-10-CM | POA: Diagnosis not present

## 2018-06-01 IMAGING — CR DG FINGER MIDDLE 2+V*L*
3 series · 3 of 3 positions shown · non-contrast
Comparison: None.

CLINICAL DATA: Left middle finger pain, post distal
dislocation/relocation.

EXAM:
LEFT MIDDLE FINGER 2+V

[x finger pa left]
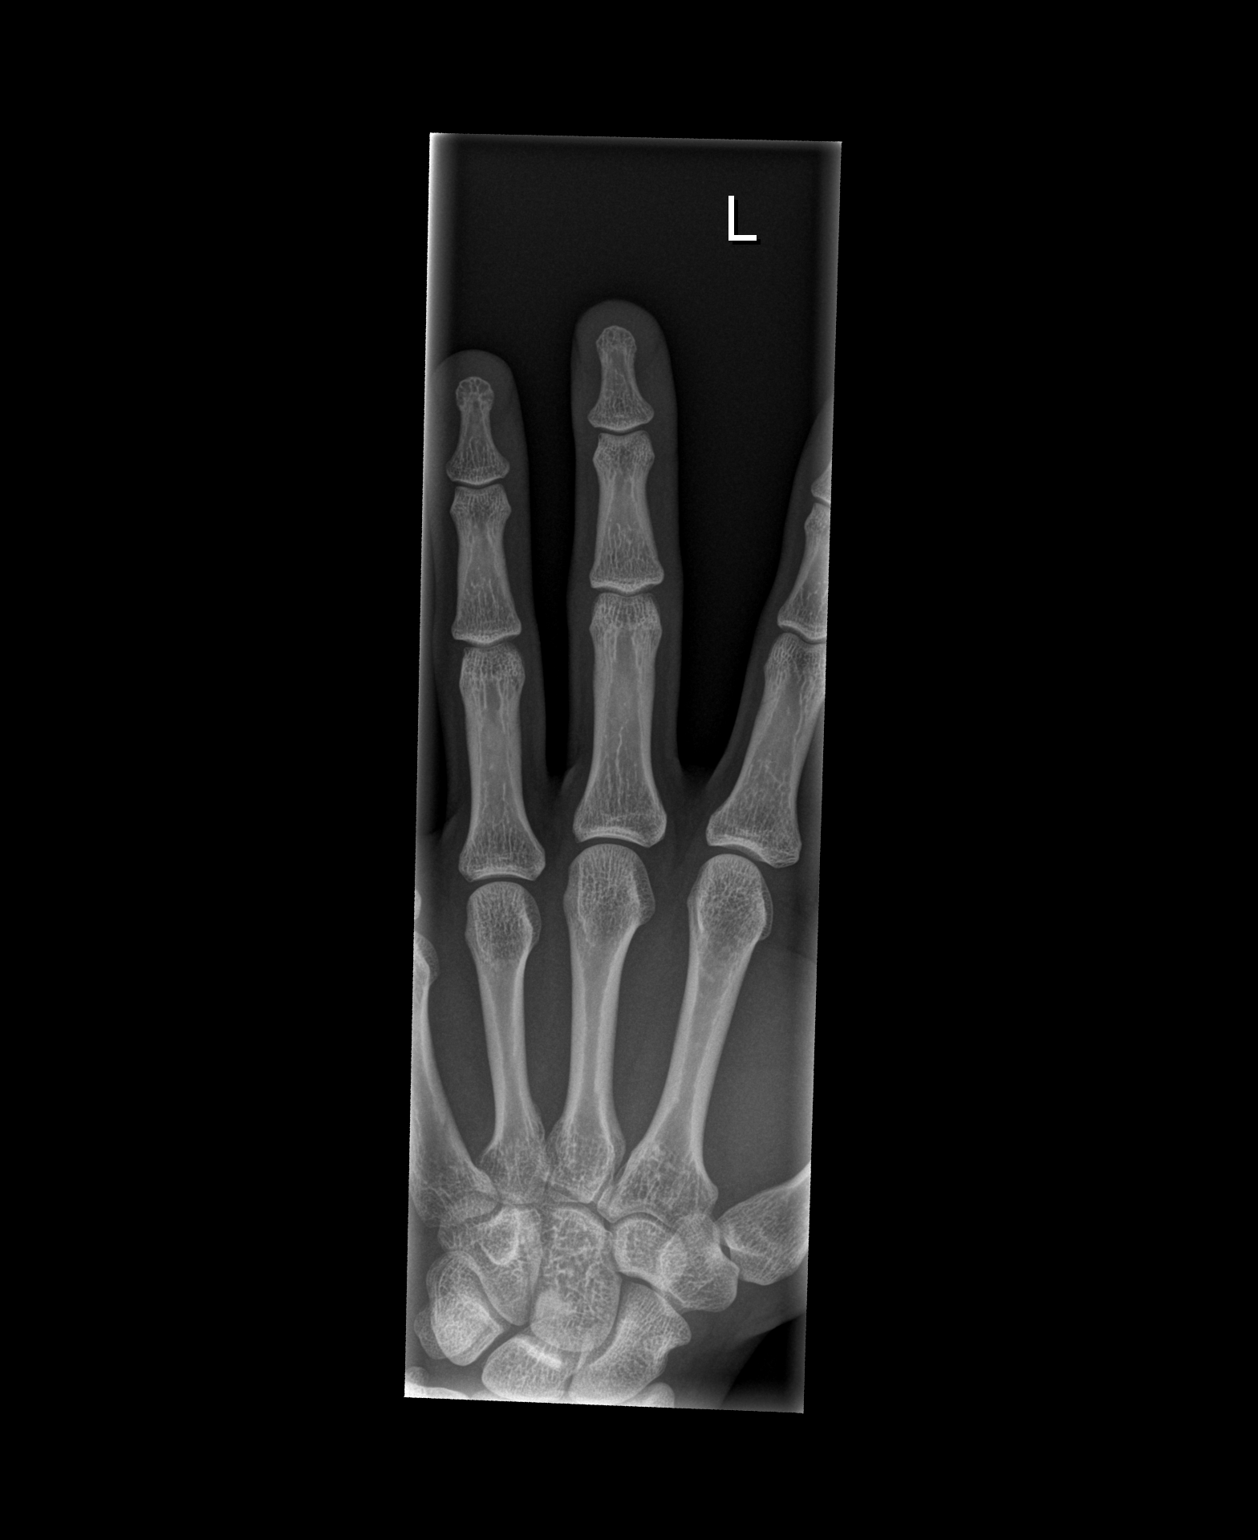

[x finger obl left]
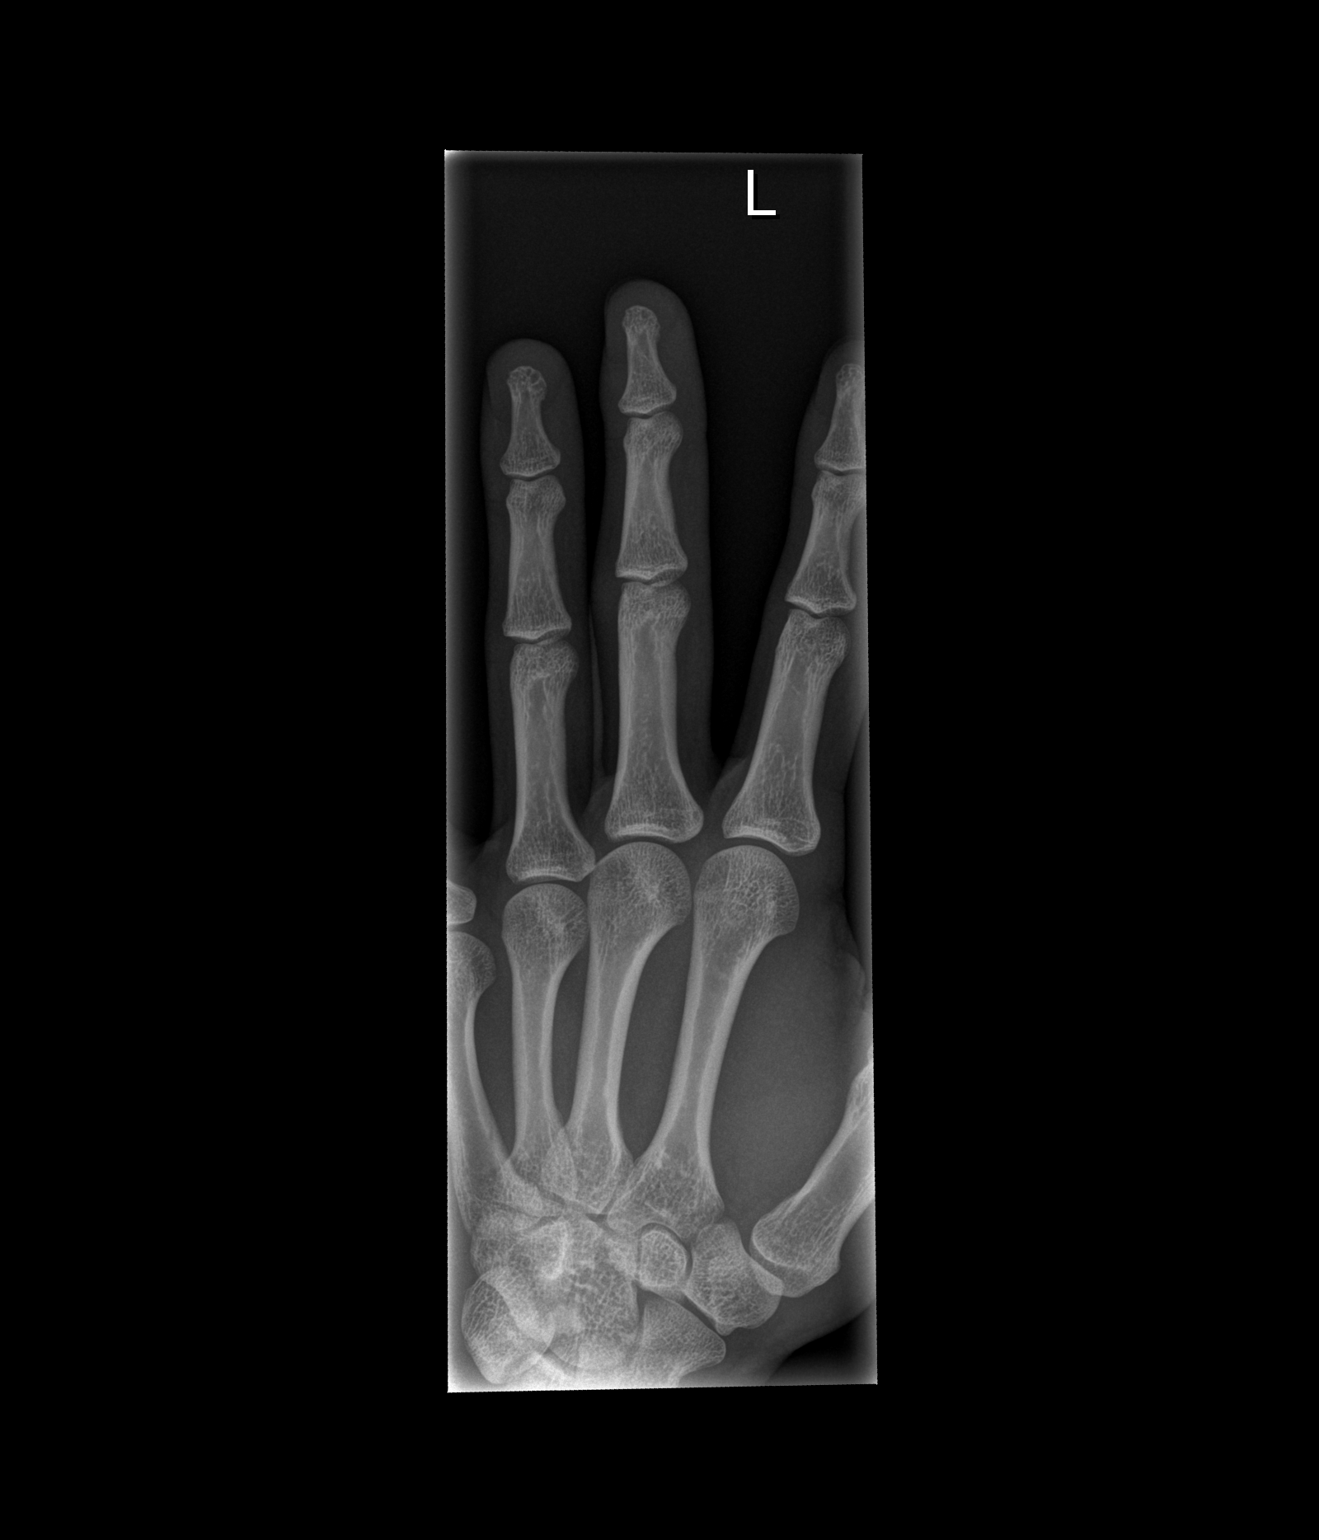

[x finger lat left]
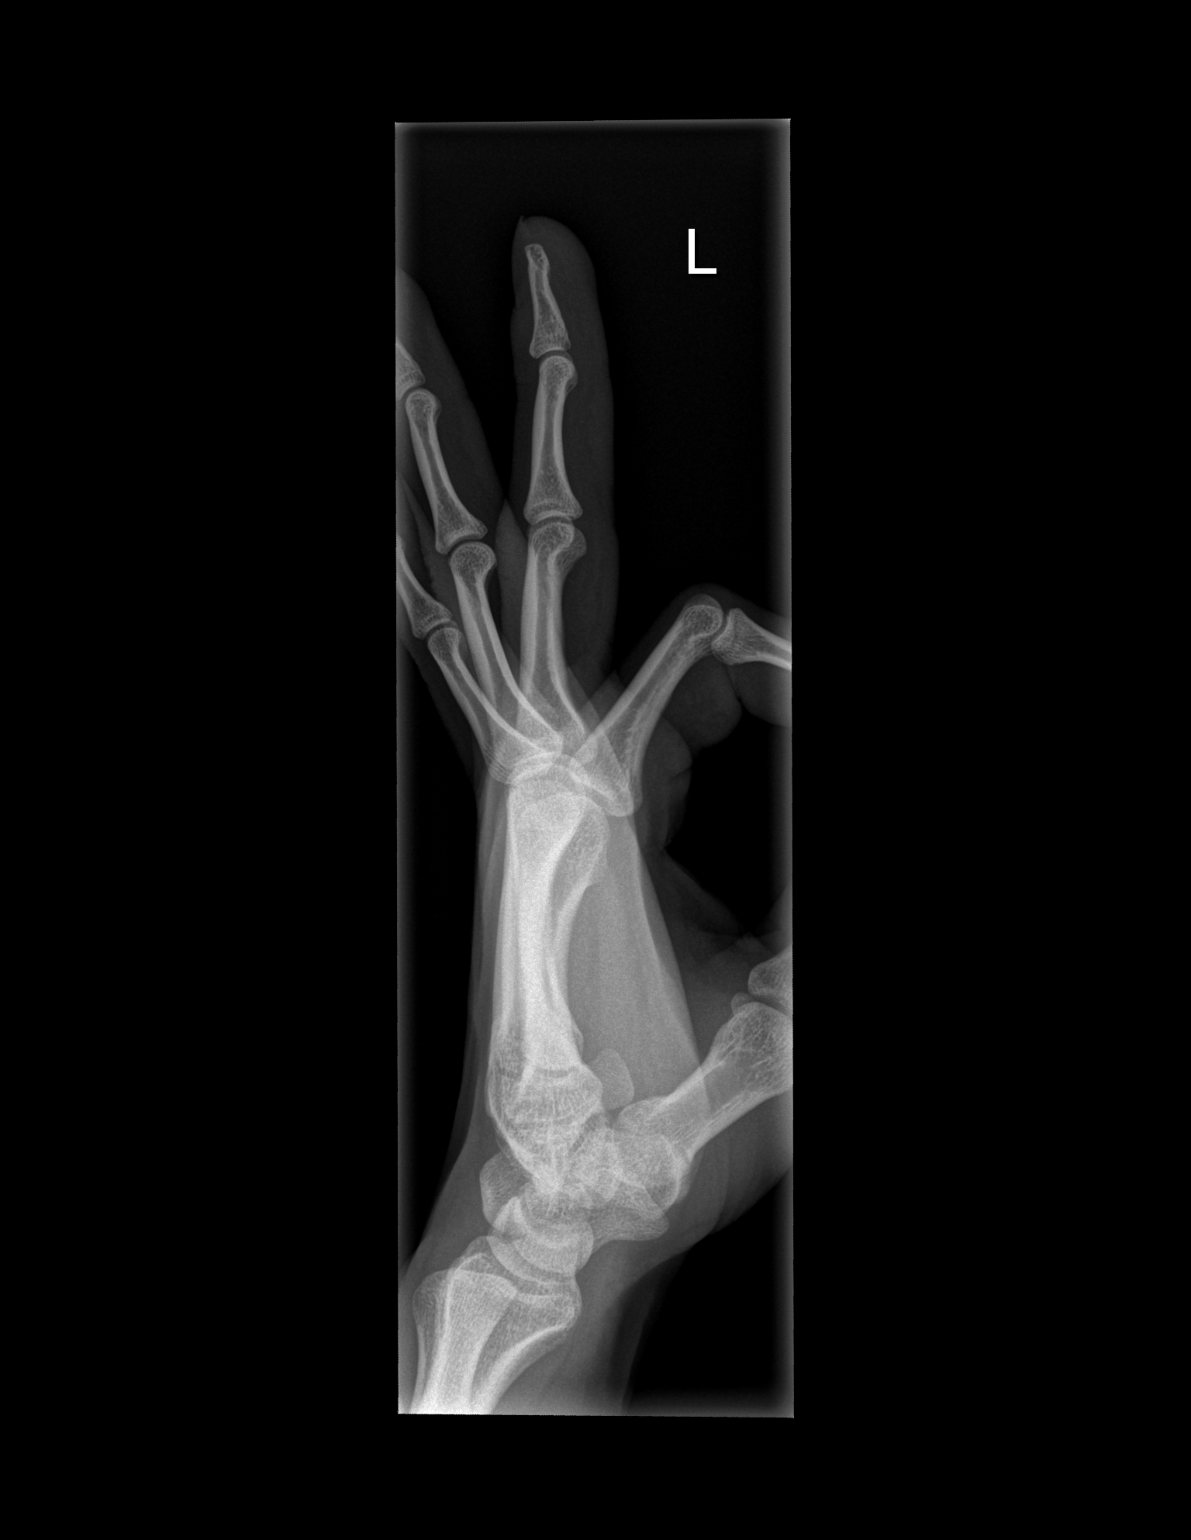

[3 of 3 positions shown; findings below may reference images not displayed]

FINDINGS: There is no evidence of fracture or dislocation. There is no
evidence of arthropathy or other focal bone abnormality. Mild soft
tissues filling of the distal third phalanx.
IMPRESSION: No acute fracture or dislocation identified about the left third
phalanx.

## 2018-06-06 ENCOUNTER — Ambulatory Visit: Payer: 59 | Admitting: Family Medicine

## 2018-06-07 ENCOUNTER — Ambulatory Visit: Payer: 59 | Admitting: Family Medicine

## 2018-06-10 ENCOUNTER — Ambulatory Visit (INDEPENDENT_AMBULATORY_CARE_PROVIDER_SITE_OTHER): Payer: 59 | Admitting: Family Medicine

## 2018-06-10 ENCOUNTER — Encounter: Payer: Self-pay | Admitting: Family Medicine

## 2018-06-10 ENCOUNTER — Ambulatory Visit: Payer: 59 | Admitting: Family Medicine

## 2018-06-10 VITALS — BP 108/74 | HR 64 | Temp 98.2°F | Ht 70.0 in | Wt 147.6 lb

## 2018-06-10 DIAGNOSIS — Z0000 Encounter for general adult medical examination without abnormal findings: Secondary | ICD-10-CM | POA: Diagnosis not present

## 2018-06-10 NOTE — Patient Instructions (Signed)

## 2018-06-10 NOTE — Progress Notes (Signed)
Curtis Deleon Kaseman - 27 y.o. male MRN [id number]  Date of birth: 11/27/1991  Subjective Chief Complaint  Patient presents with  . Establish Care    CPE not fasting    HPI Curtis Deleon Petraitis is a 27 y.o. male here today for annual CPE.  He is relatively healthy and has no concerns today.  He follows a fairly healthy diet.  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  Had ACL repair last year, doing well from this.    Review of Systems  Constitutional: Negative for chills, fever, malaise/fatigue and weight loss.  HENT: Negative for congestion, ear pain and sore throat.   Eyes: Negative for blurred vision, double vision and pain.  Respiratory: Negative for cough and shortness of breath.   Cardiovascular: Negative for chest pain and palpitations.  Gastrointestinal: Negative for abdominal pain, blood in stool, constipation, heartburn and nausea.  Genitourinary: Negative for dysuria and urgency.  Musculoskeletal: Negative for joint pain and myalgias.  Neurological: Negative for dizziness and headaches.  Endo/Heme/Allergies: Does not bruise/bleed easily.  Psychiatric/Behavioral: Negative for depression. The patient is not nervous/anxious and does not have insomnia.     No Known Allergies  Past Medical History:  Diagnosis Date  . Acute medial meniscus tear of left knee   . Asthma   . Tears of meniscus and ACL of left knee, subsequent encounter     Past Surgical History:  Procedure Laterality Date  . KNEE ARTHROSCOPY WITH ANTERIOR CRUCIATE LIGAMENT (ACL) REPAIR WITH HAMSTRING GRAFT Left 08/07/2017   Procedure: LEFT KNEE ARTHROSCOPY WITH ANTERIOR CRUCIATE LIGAMENT (ACL) REPAIR WITH HAMSTRING GRAFT;  Surgeon: Salvatore MarvelWainer, Robert, MD;  Location: [facility];  Service: Orthopedics;  Laterality: Left;  . KNEE ARTHROSCOPY WITH MEDIAL MENISECTOMY Left 08/07/2017   Procedure: LEFT KNEE ARTHROSCOPY WITH MEDIAL MENISECTOMY;  Surgeon: Salvatore MarvelWainer, Robert, MD;  Location: [facility];  Service: Orthopedics;  Laterality: Left;  . NO PAST SURGERIES      Social History   Socioeconomic History  . Marital status: Single    Spouse name: Not on file  . Number of children: Not on file  . Years of education: Not on file  . Highest education level: Not on file  Occupational History  . Not on file  Social Needs  . Financial resource strain: Not on file  . Food insecurity:    Worry: Not on file    Inability: Not on file  . Transportation needs:    Medical: Not on file    Non-medical: Not on file  Tobacco Use  . Smoking status: Never Smoker  . Smokeless tobacco: Never Used  Substance and Sexual Activity  . Alcohol use: No  . Drug use: No  . Sexual activity: Not on file  Lifestyle  . Physical activity:    Days per week: Not on file    Minutes per session: Not on file  . Stress: Not on file  Relationships  . Social connections:    Talks on phone: Not on file    Gets together: Not on file    Attends religious service: Not on file    Active member of club or organization: Not on file    Attends meetings of clubs or organizations: Not on file    Relationship status: Not on file  Other Topics Concern  . Not on file  Social History Narrative  . Not on file    Family History  Problem Relation Age of Onset  .  Alcohol abuse Maternal Grandfather   . Hypertension Maternal Grandfather   . Cancer Paternal Grandmother   . Alcohol abuse Paternal Grandfather   . Cancer Paternal Grandfather   . COPD Paternal Grandfather   . Hyperlipidemia Paternal Grandfather   . Stroke Paternal Grandfather     Health Maintenance  Topic Date Due  . HIV Screening  04/10/2008  . INFLUENZA VACCINE  07/11/2018  . TETANUS/TDAP  07/17/2018    ----------------------------------------------------------------------------------------------------------------------------------------------------------------------------------------------------------------- Physical Exam BP  108/74 (BP Location: Left Arm, Patient Position: Sitting, Cuff Size: Normal)   Pulse 64   Temp 98.2 F (36.8 C) (Oral)   Ht 5\' 10"  (1.778 m)   Wt 147 lb 9.6 oz (67 kg)   SpO2 96%   BMI 21.18 kg/m   Physical Exam  Constitutional: He is oriented to person, place, and time. He appears well-nourished. No distress.  HENT:  Head: Normocephalic and atraumatic.  Mouth/Throat: Oropharynx is clear and moist.  Eyes: No scleral icterus.  Neck: Normal range of motion. Neck supple. No thyromegaly present.  Cardiovascular: Normal rate, regular rhythm, normal heart sounds and intact distal pulses.  Pulmonary/Chest: Effort normal and breath sounds normal.  Abdominal: Soft. Bowel sounds are normal. He exhibits no distension. There is no tenderness.  Musculoskeletal: He exhibits no edema.  Lymphadenopathy:    He has no cervical adenopathy.  Neurological: He is alert and oriented to person, place, and time.  Skin: Skin is warm and dry. No rash noted.  Psychiatric: He has a normal mood and affect. His behavior is normal.    ------------------------------------------------------------------------------------------------------------------------------------------------------------------------------------------------------------------- Assessment and Plan  Encounter for well adult exam without abnormal findings Well adult Labs declined today Immunizations:  He believes he is up to date.  Anticipatory guidance/Risk factor reduction:  See AVS

## 2018-06-10 NOTE — Assessment & Plan Note (Signed)
Well adult Labs declined today Immunizations:  He believes he is up to date.  Anticipatory guidance/Risk factor reduction:  See AVS

## 2019-09-17 ENCOUNTER — Encounter: Payer: Self-pay | Admitting: Family Medicine

## 2019-10-31 ENCOUNTER — Other Ambulatory Visit: Payer: Self-pay

## 2019-10-31 ENCOUNTER — Encounter: Payer: Self-pay | Admitting: Family Medicine

## 2019-10-31 ENCOUNTER — Ambulatory Visit (INDEPENDENT_AMBULATORY_CARE_PROVIDER_SITE_OTHER): Payer: No Typology Code available for payment source | Admitting: Family Medicine

## 2019-10-31 VITALS — BP 110/60 | HR 60 | Temp 97.4°F | Ht 70.0 in | Wt 150.4 lb

## 2019-10-31 DIAGNOSIS — Z23 Encounter for immunization: Secondary | ICD-10-CM

## 2019-10-31 DIAGNOSIS — R202 Paresthesia of skin: Secondary | ICD-10-CM

## 2019-10-31 DIAGNOSIS — N4830 Priapism, unspecified: Secondary | ICD-10-CM | POA: Diagnosis not present

## 2019-10-31 DIAGNOSIS — R0789 Other chest pain: Secondary | ICD-10-CM

## 2019-10-31 DIAGNOSIS — Z13220 Encounter for screening for lipoid disorders: Secondary | ICD-10-CM | POA: Diagnosis not present

## 2019-10-31 LAB — CBC WITH DIFFERENTIAL/PLATELET
Basophils Absolute: 0 10*3/uL (ref 0.0–0.1)
Basophils Relative: 0.9 % (ref 0.0–3.0)
Eosinophils Absolute: 0.1 10*3/uL (ref 0.0–0.7)
Eosinophils Relative: 1.6 % (ref 0.0–5.0)
HCT: 44.2 % (ref 39.0–52.0)
Hemoglobin: 14.8 g/dL (ref 13.0–17.0)
Lymphocytes Relative: 30.4 % (ref 12.0–46.0)
Lymphs Abs: 0.9 10*3/uL (ref 0.7–4.0)
MCHC: 33.4 g/dL (ref 30.0–36.0)
MCV: 88.2 fl (ref 78.0–100.0)
Monocytes Absolute: 0.3 10*3/uL (ref 0.1–1.0)
Monocytes Relative: 9.8 % (ref 3.0–12.0)
Neutro Abs: 1.8 10*3/uL (ref 1.4–7.7)
Neutrophils Relative %: 57.3 % (ref 43.0–77.0)
Platelets: 186 10*3/uL (ref 150.0–400.0)
RBC: 5.01 Mil/uL (ref 4.22–5.81)
RDW: 13 % (ref 11.5–15.5)
WBC: 3.1 10*3/uL — ABNORMAL LOW (ref 4.0–10.5)

## 2019-10-31 LAB — COMPREHENSIVE METABOLIC PANEL
ALT: 16 U/L (ref 0–53)
AST: 17 U/L (ref 0–37)
Albumin: 4.2 g/dL (ref 3.5–5.2)
Alkaline Phosphatase: 38 U/L — ABNORMAL LOW (ref 39–117)
BUN: 15 mg/dL (ref 6–23)
CO2: 31 mEq/L (ref 19–32)
Calcium: 9.2 mg/dL (ref 8.4–10.5)
Chloride: 104 mEq/L (ref 96–112)
Creatinine, Ser: 1.15 mg/dL (ref 0.40–1.50)
GFR: 76.54 mL/min (ref >60.00)
Glucose, Bld: 51 mg/dL — ABNORMAL LOW (ref 70–99)
Potassium: 4 mEq/L (ref 3.5–5.1)
Sodium: 142 mEq/L (ref 135–145)
Total Bilirubin: 0.7 mg/dL (ref 0.2–1.2)
Total Protein: 6.5 g/dL (ref 6.0–8.3)

## 2019-10-31 LAB — TSH: TSH: 1.78 u[IU]/mL (ref 0.35–4.50)

## 2019-10-31 LAB — VITAMIN B12: Vitamin B-12: 238 pg/mL (ref 211–911)

## 2019-10-31 NOTE — Patient Instructions (Signed)
We'll be in touch with lab results  You should get a call from urology to set up appt.

## 2019-11-04 DIAGNOSIS — R202 Paresthesia of skin: Secondary | ICD-10-CM | POA: Insufficient documentation

## 2019-11-04 DIAGNOSIS — R079 Chest pain, unspecified: Secondary | ICD-10-CM | POA: Insufficient documentation

## 2019-11-04 DIAGNOSIS — Z13220 Encounter for screening for lipoid disorders: Secondary | ICD-10-CM | POA: Insufficient documentation

## 2019-11-04 DIAGNOSIS — N4830 Priapism, unspecified: Secondary | ICD-10-CM | POA: Insufficient documentation

## 2019-11-04 NOTE — Progress Notes (Signed)
Curtis Deleon - 28 y.o. male MRN [id number]  Date of birth: November 05, 1991  Subjective Chief Complaint  Patient presents with  . Chest Pain    pt said he's been having bad chest pain for about a year. pt notice weird purple lines across his stomach. Pt legs and feet would go numb after sitting for awhile. Pt think his blood is not pumping enough for his heart. pt is also having erectile/penis pain.    HPI Curtis Deleon is a 27 y.o. male here today with a few complaints.  He reports having intermittent chest pain, intermittent numbness in legs and erectile pain.   -Chest pain: Reports intermittent episodes of chest pain.  Describes as sharp pain, lasting a few seconds to a couple of minutes.  This occurs randomly and is not associated with exertion. He denies associated shortness of breath, palpitations, dizziness. He denies reflux symptoms. He does not feel overly anxious.   -Numbness in legs:  Reports numb sensation in legs if sitting for long periods of time.  Denies low back pain or weakness.  Denies cramping sensation in lower extremities.    -Erectile pain:  Reports pain with erection.  States that a few months ago he had an erection and accidentally bent his penis.  Since that time he feels like his erection is not as strong and has some pain with erection.  He denies any problems urinating.  He denies any penile curvature.   ROS:  A comprehensive ROS was completed and negative except as noted per HPI  No Known Allergies  Past Medical History:  Diagnosis Date  . Acute medial meniscus tear of left knee   . Asthma   . Tears of meniscus and ACL of left knee, subsequent encounter     Past Surgical History:  Procedure Laterality Date  . KNEE ARTHROSCOPY WITH ANTERIOR CRUCIATE LIGAMENT (ACL) REPAIR WITH HAMSTRING GRAFT Left 08/07/2017   Procedure: LEFT KNEE ARTHROSCOPY WITH ANTERIOR CRUCIATE LIGAMENT (ACL) REPAIR WITH HAMSTRING GRAFT;  Surgeon: Elsie Saas, MD;  Location: [facility];  Service: Orthopedics;  Laterality: Left;  . KNEE ARTHROSCOPY WITH MEDIAL MENISECTOMY Left 08/07/2017   Procedure: LEFT KNEE ARTHROSCOPY WITH MEDIAL MENISECTOMY;  Surgeon: Elsie Saas, MD;  Location: [facility];  Service: Orthopedics;  Laterality: Left;  . NO PAST SURGERIES      Social History   Socioeconomic History  . Marital status: Single    Spouse name: Not on file  . Number of children: Not on file  . Years of education: Not on file  . Highest education level: Not on file  Occupational History  . Not on file  Social Needs  . Financial resource strain: Not on file  . Food insecurity    Worry: Not on file    Inability: Not on file  . Transportation needs    Medical: Not on file    Non-medical: Not on file  Tobacco Use  . Smoking status: Never Smoker  . Smokeless tobacco: Never Used  Substance and Sexual Activity  . Alcohol use: No  . Drug use: No  . Sexual activity: Not on file  Lifestyle  . Physical activity    Days per week: Not on file    Minutes per session: Not on file  . Stress: Not on file  Relationships  . Social Herbalist on phone: Not on file    Gets together: Not on file    Attends religious service: Not  on file    Active member of club or organization: Not on file    Attends meetings of clubs or organizations: Not on file    Relationship status: Not on file  Other Topics Concern  . Not on file  Social History Narrative  . Not on file    Family History  Problem Relation Age of Onset  . Alcohol abuse Maternal Grandfather   . Hypertension Maternal Grandfather   . Cancer Paternal Grandmother   . Alcohol abuse Paternal Grandfather   . Cancer Paternal Grandfather   . COPD Paternal Grandfather   . Hyperlipidemia Paternal Grandfather   . Stroke Paternal Grandfather     Health Maintenance  Topic Date Due  . HIV Screening  04/10/2008  . TETANUS/TDAP  07/17/2018  . INFLUENZA VACCINE  Completed     ----------------------------------------------------------------------------------------------------------------------------------------------------------------------------------------------------------------- Physical Exam BP 110/60   Pulse 60   Temp (!) 97.4 F (36.3 C) (Temporal)   Ht 5\' 10"  (1.778 m)   Wt 150 lb 6.4 oz (68.2 kg)   SpO2 98%   BMI 21.58 kg/m   Physical Exam Constitutional:      Appearance: He is well-developed.  HENT:     Head: Normocephalic and atraumatic.  Eyes:     General: No scleral icterus. Neck:     Musculoskeletal: Neck supple.  Cardiovascular:     Rate and Rhythm: Normal rate and regular rhythm.     Heart sounds: Normal heart sounds.  Pulmonary:     Effort: Pulmonary effort is normal.     Breath sounds: Normal breath sounds.  Abdominal:     General: Abdomen is flat. There is no distension.     Tenderness: There is no abdominal tenderness.  Skin:    General: Skin is warm and dry.     Findings: No rash.  Neurological:     General: No focal deficit present.     Mental Status: He is alert and oriented to person, place, and time.     Motor: No weakness.     Gait: Gait normal.     Comments: DTR 2+.  Normal sensation in foot  Psychiatric:        Mood and Affect: Mood normal.        Behavior: Behavior normal.     ------------------------------------------------------------------------------------------------------------------------------------------------------------------------------------------------------------------- Assessment and Plan  Chest pain -Atypical chest pain, cardiac etiology unlikely based on history and chronicity of pain. -Denies reflux symptoms  -?anxiety related although he denies feeling overly anxious.      Paresthesias Labs ordered today including B12.  No red flags on exam or history.  Discussed trying standing desk or different chair while working.   Painful erection Referral entered to urology.   Screening for  lipid disorders Family history of lipid disorder. Lipid panel order to help risk stratify for cardiac disease  This visit occurred during the SARS-CoV-2 public health emergency.  Safety protocols were in place, including screening questions prior to the visit, additional usage of staff PPE, and extensive cleaning of exam room while observing appropriate contact time as indicated for disinfecting solutions.

## 2019-11-04 NOTE — Assessment & Plan Note (Signed)
Referral entered to urology

## 2019-11-04 NOTE — Assessment & Plan Note (Signed)
-  Atypical chest pain, cardiac etiology unlikely based on history and chronicity of pain. -Denies reflux symptoms  -?anxiety related although he denies feeling overly anxious.

## 2019-11-04 NOTE — Assessment & Plan Note (Signed)
Labs ordered today including B12.  No red flags on exam or history.  Discussed trying standing desk or different chair while working.

## 2019-11-04 NOTE — Assessment & Plan Note (Signed)
Family history of lipid disorder. Lipid panel order to help risk stratify for cardiac disease

## 2019-11-10 ENCOUNTER — Other Ambulatory Visit: Payer: Self-pay | Admitting: Family Medicine

## 2019-11-10 DIAGNOSIS — D72819 Decreased white blood cell count, unspecified: Secondary | ICD-10-CM

## 2019-11-10 NOTE — Progress Notes (Signed)
Please let patient know: B12 levels are on the low end of normal.  Recommend OTC B12 supplement of 1081mcg daily  WBC count is a little low.  Not too concerned about this but would like to recheck in a few weeks.  Please arrange for 3-4 week lab visit.    Thanks!

## 2019-11-26 ENCOUNTER — Other Ambulatory Visit: Payer: Self-pay

## 2019-11-27 ENCOUNTER — Other Ambulatory Visit (INDEPENDENT_AMBULATORY_CARE_PROVIDER_SITE_OTHER): Payer: No Typology Code available for payment source

## 2019-11-27 ENCOUNTER — Encounter: Payer: Self-pay | Admitting: Family Medicine

## 2019-11-27 DIAGNOSIS — Z13220 Encounter for screening for lipoid disorders: Secondary | ICD-10-CM

## 2019-11-27 DIAGNOSIS — D72819 Decreased white blood cell count, unspecified: Secondary | ICD-10-CM | POA: Diagnosis not present

## 2019-11-27 LAB — LIPID PANEL
Cholesterol: 160 mg/dL (ref 0–200)
HDL: 54 mg/dL (ref >39.00)
LDL Cholesterol: 95 mg/dL (ref 0–99)
NonHDL: 105.92
Total CHOL/HDL Ratio: 3
Triglycerides: 57 mg/dL (ref 0.0–149.0)
VLDL: 11.4 mg/dL (ref 0.0–40.0)

## 2019-11-27 LAB — CBC WITH DIFFERENTIAL/PLATELET
Basophils Absolute: 0 10*3/uL (ref 0.0–0.1)
Basophils Relative: 0.8 % (ref 0.0–3.0)
Eosinophils Absolute: 0.1 10*3/uL (ref 0.0–0.7)
Eosinophils Relative: 1.6 % (ref 0.0–5.0)
HCT: 48.2 % (ref 39.0–52.0)
Hemoglobin: 16.2 g/dL (ref 13.0–17.0)
Lymphocytes Relative: 32.7 % (ref 12.0–46.0)
Lymphs Abs: 1.5 10*3/uL (ref 0.7–4.0)
MCHC: 33.6 g/dL (ref 30.0–36.0)
MCV: 88.7 fl (ref 78.0–100.0)
Monocytes Absolute: 0.5 10*3/uL (ref 0.1–1.0)
Monocytes Relative: 10.5 % (ref 3.0–12.0)
Neutro Abs: 2.5 10*3/uL (ref 1.4–7.7)
Neutrophils Relative %: 54.4 % (ref 43.0–77.0)
Platelets: 202 10*3/uL (ref 150.0–400.0)
RBC: 5.43 Mil/uL (ref 4.22–5.81)
RDW: 12.8 % (ref 11.5–15.5)
WBC: 4.5 10*3/uL (ref 4.0–10.5)

## 2020-08-18 ENCOUNTER — Other Ambulatory Visit: Payer: Self-pay

## 2020-08-19 ENCOUNTER — Ambulatory Visit (INDEPENDENT_AMBULATORY_CARE_PROVIDER_SITE_OTHER): Payer: No Typology Code available for payment source | Admitting: Family Medicine

## 2020-08-19 ENCOUNTER — Encounter: Payer: Self-pay | Admitting: Family Medicine

## 2020-08-19 VITALS — BP 112/68 | HR 54 | Temp 98.6°F | Ht 70.0 in | Wt 151.8 lb

## 2020-08-19 DIAGNOSIS — Z711 Person with feared health complaint in whom no diagnosis is made: Secondary | ICD-10-CM | POA: Diagnosis not present

## 2020-08-19 DIAGNOSIS — Z0000 Encounter for general adult medical examination without abnormal findings: Secondary | ICD-10-CM

## 2020-08-19 NOTE — Progress Notes (Signed)
Curtis Deleon is a 29 y.o. male  Chief Complaint  Patient presents with  . Follow-up    still having easy bruising, sweating, and not healing well    HPI: Curtis Deleon is a 29 y.o. male who was previously a patient of Dr. Ashley Royalty seen today and complains of easy bruising, "poor healing", sweating with minimal to moderate exertion. Pt is worried about his circulation. No fatigue, CP, SOB, edema. Pt exercises regularly and is able to keep up with teammates and friends while playing. No change in exercise tolerance.  Normal CBC, electrolytes in 11/2020. Fasting BS low - asymptomatic.  Pt does not smoke, normal weight.  Pt formerly worked as Museum/gallery exhibitions officer.  Lab Results  Component Value Date   WBC 4.5 11/27/2019   HGB 16.2 11/27/2019   HCT 48.2 11/27/2019   MCV 88.7 11/27/2019   PLT 202.0 11/27/2019   Component     Latest Ref Rng & Units 10/31/2019  Sodium     135 - 145 mEq/L 142  Potassium     3.5 - 5.1 mEq/L 4.0  Chloride     96 - 112 mEq/L 104  CO2     19 - 32 mEq/L 31  Glucose     70 - 99 mg/dL 51 (L)  BUN     6 - 23 mg/dL 15  Creatinine     4.09 - 1.50 mg/dL 8.11  Total Bilirubin     0.2 - 1.2 mg/dL 0.7  Alkaline Phosphatase     39 - 117 U/L 38 (L)  AST     0 - 37 U/L 17  ALT     0 - 53 U/L 16  Total Protein     6.0 - 8.3 g/dL 6.5  Albumin     3.5 - 5.2 g/dL 4.2  GFR     >91.47 mL/min 76.54  Calcium     8.4 - 10.5 mg/dL 9.2     Past Medical History:  Diagnosis Date  . Acute medial meniscus tear of left knee   . Asthma   . Tears of meniscus and ACL of left knee, subsequent encounter     Past Surgical History:  Procedure Laterality Date  . KNEE ARTHROSCOPY WITH ANTERIOR CRUCIATE LIGAMENT (ACL) REPAIR WITH HAMSTRING GRAFT Left 08/07/2017   Procedure: LEFT KNEE ARTHROSCOPY WITH ANTERIOR CRUCIATE LIGAMENT (ACL) REPAIR WITH HAMSTRING GRAFT;  Surgeon: Salvatore Marvel, MD;  Location: [facility];  Service: Orthopedics;  Laterality: Left;  .  KNEE ARTHROSCOPY WITH MEDIAL MENISECTOMY Left 08/07/2017   Procedure: LEFT KNEE ARTHROSCOPY WITH MEDIAL MENISECTOMY;  Surgeon: Salvatore Marvel, MD;  Location: [facility];  Service: Orthopedics;  Laterality: Left;  . NO PAST SURGERIES      Social History   Socioeconomic History  . Marital status: Single    Spouse name: Not on file  . Number of children: Not on file  . Years of education: Not on file  . Highest education level: Not on file  Occupational History  . Not on file  Tobacco Use  . Smoking status: Never Smoker  . Smokeless tobacco: Never Used  Vaping Use  . Vaping Use: Never used  Substance and Sexual Activity  . Alcohol use: No  . Drug use: No  . Sexual activity: Not on file  Other Topics Concern  . Not on file  Social History Narrative  . Not on file   Social Determinants of Health   Financial Resource Strain:   .  Difficulty of Paying Living Expenses: Not on file  Food Insecurity:   . Worried About Programme researcher, broadcasting/film/video in the Last Year: Not on file  . Ran Out of Food in the Last Year: Not on file  Transportation Needs:   . Lack of Transportation (Medical): Not on file  . Lack of Transportation (Non-Medical): Not on file  Physical Activity:   . Days of Exercise per Week: Not on file  . Minutes of Exercise per Session: Not on file  Stress:   . Feeling of Stress : Not on file  Social Connections:   . Frequency of Communication with Friends and Family: Not on file  . Frequency of Social Gatherings with Friends and Family: Not on file  . Attends Religious Services: Not on file  . Active Member of Clubs or Organizations: Not on file  . Attends Banker Meetings: Not on file  . Marital Status: Not on file  Intimate Partner Violence:   . Fear of Current or Ex-Partner: Not on file  . Emotionally Abused: Not on file  . Physically Abused: Not on file  . Sexually Abused: Not on file    Family History  Problem Relation Age of Onset  .  Alcohol abuse Maternal Grandfather   . Hypertension Maternal Grandfather   . Cancer Paternal Grandmother   . Alcohol abuse Paternal Grandfather   . Cancer Paternal Grandfather   . COPD Paternal Grandfather   . Hyperlipidemia Paternal Grandfather   . Stroke Paternal Grandfather      Immunization History  Administered Date(s) Administered  . Influenza,inj,Quad PF,6+ Mos 10/31/2019  . PFIZER SARS-COV-2 Vaccination 03/01/2020, 03/22/2020  . Td 07/17/2008    No outpatient encounter medications on file as of 08/19/2020.   No facility-administered encounter medications on file as of 08/19/2020.     ROS: Pertinent positives and negatives noted in HPI. Remainder of ROS non-contributory    No Known Allergies  BP 112/68   Pulse (!) 54   Temp 98.6 F (37 C) (Temporal)   Ht 5\' 10"  (1.778 m)   Wt 151 lb 12.8 oz (68.9 kg)   SpO2 98%   BMI 21.78 kg/m   BP Readings from Last 3 Encounters:  08/19/20 112/68  10/31/19 110/60  06/10/18 108/74   Pulse Readings from Last 3 Encounters:  08/19/20 (!) 54  10/31/19 60  06/10/18 64   Wt Readings from Last 3 Encounters:  08/19/20 151 lb 12.8 oz (68.9 kg)  10/31/19 150 lb 6.4 oz (68.2 kg)  06/10/18 147 lb 9.6 oz (67 kg)    Physical Exam Vitals reviewed.  Constitutional:      General: He is not in acute distress.    Appearance: Normal appearance. He is not ill-appearing.  Cardiovascular:     Rate and Rhythm: Normal rate and regular rhythm.     Pulses: Normal pulses.     Heart sounds: Normal heart sounds. No murmur heard.   Pulmonary:     Effort: Pulmonary effort is normal.     Breath sounds: Normal breath sounds.  Musculoskeletal:     Right lower leg: No edema.     Left lower leg: No edema.  Skin:    General: Skin is warm and dry.     Capillary Refill: Capillary refill takes less than 2 seconds.  Neurological:     Mental Status: He is alert and oriented to person, place, and time.      A/P:  1. Normal exam 2.  Physically well but worried - normal labs, vitals, exam - pt is young, healthy, minimal CV risk factors other than family hx - I do not think there is any underlying pathology or concerning explanation for pts complaints - cont with regular exercise, healthy diet  Total time spent during today's encounter =  This visit occurred during the SARS-CoV-2 public health emergency.  Safety protocols were in place, including screening questions prior to the visit, additional usage of staff PPE, and extensive cleaning of exam room while observing appropriate contact time as indicated for disinfecting solutions.

## 2020-08-27 ENCOUNTER — Telehealth: Payer: Self-pay | Admitting: General Practice

## 2020-08-27 DIAGNOSIS — Z8249 Family history of ischemic heart disease and other diseases of the circulatory system: Secondary | ICD-10-CM

## 2020-08-27 DIAGNOSIS — R0789 Other chest pain: Secondary | ICD-10-CM

## 2020-08-27 DIAGNOSIS — Z83438 Family history of other disorder of lipoprotein metabolism and other lipidemia: Secondary | ICD-10-CM

## 2020-08-27 DIAGNOSIS — F418 Other specified anxiety disorders: Secondary | ICD-10-CM

## 2020-08-27 DIAGNOSIS — Z711 Person with feared health complaint in whom no diagnosis is made: Secondary | ICD-10-CM

## 2020-08-27 NOTE — Telephone Encounter (Signed)
I don't see asthma dx listed in his chart or a previous Rx for an inhaler  I will place the referral to cardiology and they can determine if pt needs any further testing.

## 2020-08-27 NOTE — Telephone Encounter (Signed)
Pt was notified and is aware of the referral placed for cardiology and already has the appointment set.  Pt states the only diagnosis he has is asthma, I let the patient know that we didn't see that in his chart at all, therefore, we can't send in a Rx for the inhaler.

## 2020-08-27 NOTE — Telephone Encounter (Signed)
Patient is calling and wanted to speak to someone regarding medication and recent ov, please advise. CB is [phone]

## 2020-08-27 NOTE — Telephone Encounter (Signed)
Dr C please advise.  Pt is asking for a inhaler for his asthma and then a referral maybe to Cardiology-he was interested in a ultrasound of his heart or stress test. Pt worried about circulation and family heart history.

## 2020-09-01 MED ORDER — ALBUTEROL SULFATE HFA 108 (90 BASE) MCG/ACT IN AERS: 2.0000 | INHALATION_SPRAY | Freq: Four times a day (QID) | RESPIRATORY_TRACT | 2 refills | Status: AC | PRN

## 2020-09-01 NOTE — Telephone Encounter (Signed)
Rx for albuterol inhaler sent to pharm

## 2020-09-01 NOTE — Telephone Encounter (Signed)
Patient is calling again regarding inhaler prescription. I gave him Dr. Frankey Shown previous message. He would like a call back with information how he can get an inhaler before his October 19th appt.

## 2020-09-01 NOTE — Telephone Encounter (Signed)
Patient states that Dr. Ashley Royalty prescribed him the inhaler back in 2019 and he also mentioned that he has weather related asthma. He asked that his chart be reviewed again an his inhaler be sent in. Please give him a call back at [phone] if you have any questions.

## 2020-09-01 NOTE — Telephone Encounter (Signed)
Pt was notified and verbally understood that his inhaler was sent to his pharmacy.

## 2020-09-01 NOTE — Addendum Note (Signed)
Addended by: Overton Mam on: 09/01/2020 01:50 PM   Modules accepted: Orders

## 2020-09-01 NOTE — Telephone Encounter (Signed)
Dr. Salena Saner please advise.  Pt states Dr. Jerolyn Center prescribed him the inhaler in 2019(the only asthma diagnosis I see in his 06/10/2018 visit is listed under Past Medical History) an he has weather related asthma an wanted his chart reviewed again an inhaler sent in before his 10/19 appointment with Doreene Burke.

## 2020-09-24 ENCOUNTER — Ambulatory Visit: Payer: No Typology Code available for payment source | Admitting: Cardiology

## 2020-09-27 ENCOUNTER — Other Ambulatory Visit: Payer: Self-pay

## 2020-09-28 ENCOUNTER — Encounter: Payer: No Typology Code available for payment source | Admitting: Family Medicine

## 2020-10-03 NOTE — Progress Notes (Deleted)
Cardiology Office Note:   Date:  10/03/2020  NAME:  Curtis Deleon    MRN: [id number] DOB:  [birth year]   PCP:  No primary care provider on file.  Cardiologist:  No primary care provider on file.  Electrophysiologist:  None   Referring MD: Overton Mam, DO   No chief complaint on file. ***  History of Present Illness:   Curtis Deleon is a 29 y.o. male with a hx of asthma who is being seen today for the evaluation of chest pain at the request of Overton Mam, DO.  Past Medical History: Past Medical History:  Diagnosis Date  . Acute medial meniscus tear of left knee   . Asthma   . Tears of meniscus and ACL of left knee, subsequent encounter     Past Surgical History: Past Surgical History:  Procedure Laterality Date  . KNEE ARTHROSCOPY WITH ANTERIOR CRUCIATE LIGAMENT (ACL) REPAIR WITH HAMSTRING GRAFT Left 08/07/2017   Procedure: LEFT KNEE ARTHROSCOPY WITH ANTERIOR CRUCIATE LIGAMENT (ACL) REPAIR WITH HAMSTRING GRAFT;  Surgeon: Salvatore Marvel, MD;  Location: [facility];  Service: Orthopedics;  Laterality: Left;  . KNEE ARTHROSCOPY WITH MEDIAL MENISECTOMY Left 08/07/2017   Procedure: LEFT KNEE ARTHROSCOPY WITH MEDIAL MENISECTOMY;  Surgeon: Salvatore Marvel, MD;  Location: [facility];  Service: Orthopedics;  Laterality: Left;  . NO PAST SURGERIES      Current Medications: No outpatient medications have been marked as taking for the 10/04/20 encounter (Appointment) with O'Neal, Ronnald Ramp, MD.     Allergies:    Patient has no known allergies.   Social History: Social History   Socioeconomic History  . Marital status: Single    Spouse name: Not on file  . Number of children: Not on file  . Years of education: Not on file  . Highest education level: Not on file  Occupational History  . Not on file  Tobacco Use  . Smoking status: Never Smoker  . Smokeless tobacco: Never Used  Vaping Use  . Vaping Use: Never used  Substance  and Sexual Activity  . Alcohol use: No  . Drug use: No  . Sexual activity: Not on file  Other Topics Concern  . Not on file  Social History Narrative  . Not on file   Social Determinants of Health   Financial Resource Strain:   . Difficulty of Paying Living Expenses: Not on file  Food Insecurity:   . Worried About Programme researcher, broadcasting/film/video in the Last Year: Not on file  . Ran Out of Food in the Last Year: Not on file  Transportation Needs:   . Lack of Transportation (Medical): Not on file  . Lack of Transportation (Non-Medical): Not on file  Physical Activity:   . Days of Exercise per Week: Not on file  . Minutes of Exercise per Session: Not on file  Stress:   . Feeling of Stress : Not on file  Social Connections:   . Frequency of Communication with Friends and Family: Not on file  . Frequency of Social Gatherings with Friends and Family: Not on file  . Attends Religious Services: Not on file  . Active Member of Clubs or Organizations: Not on file  . Attends Banker Meetings: Not on file  . Marital Status: Not on file     Family History: The patient's ***family history includes Alcohol abuse in his maternal grandfather and paternal grandfather; COPD in his paternal grandfather; Cancer in his paternal grandfather  and paternal grandmother; Hyperlipidemia in his paternal grandfather; Hypertension in his maternal grandfather; Stroke in his paternal grandfather.  ROS:   All other ROS reviewed and negative. Pertinent positives noted in the HPI.     EKGs/Labs/Other Studies Reviewed:   The following studies were personally reviewed by me today:  EKG:  EKG is *** ordered today.  The ekg ordered today demonstrates ***, and was personally reviewed by me.   Recent Labs: 10/31/2019: ALT 16; BUN 15; Creatinine, Ser 1.15; Potassium 4.0; Sodium 142; TSH 1.78 11/27/2019: Hemoglobin 16.2; Platelets 202.0   Recent Lipid Panel    Component Value Date/Time   CHOL 160 11/27/2019  0848   TRIG 57.0 11/27/2019 0848   HDL 54.00 11/27/2019 0848   CHOLHDL 3 11/27/2019 0848   VLDL 11.4 11/27/2019 0848   LDLCALC 95 11/27/2019 0848    Physical Exam:   VS:  There were no vitals taken for this visit.   Wt Readings from Last 3 Encounters:  08/19/20 151 lb 12.8 oz (68.9 kg)  10/31/19 150 lb 6.4 oz (68.2 kg)  06/10/18 147 lb 9.6 oz (67 kg)    General: Well nourished, well developed, in no acute distress Heart: Atraumatic, normal size  Eyes: PEERLA, EOMI  Neck: Supple, no JVD Endocrine: No thryomegaly Cardiac: Normal S1, S2; RRR; no murmurs, rubs, or gallops Lungs: Clear to auscultation bilaterally, no wheezing, rhonchi or rales  Abd: Soft, nontender, no hepatomegaly  Ext: No edema, pulses 2+ Musculoskeletal: No deformities, BUE and BLE strength normal and equal Skin: Warm and dry, no rashes   Neuro: Alert and oriented to person, place, time, and situation, CNII-XII grossly intact, no focal deficits  Psych: Normal mood and affect   ASSESSMENT:   Curtis Deleon is a 29 y.o. male who presents for the following: No diagnosis found.  PLAN:   There are no diagnoses linked to this encounter.  Disposition: No follow-ups on file.  Medication Adjustments/Labs and Tests Ordered: Current medicines are reviewed at length with the patient today.  Concerns regarding medicines are outlined above.  No orders of the defined types were placed in this encounter.  No orders of the defined types were placed in this encounter.   There are no Patient Instructions on file for this visit.   Time Spent with Patient: I have spent a total of *** minutes with patient reviewing hospital notes, telemetry, EKGs, labs and examining the patient as well as establishing an assessment and plan that was discussed with the patient.  > 50% of time was spent in direct patient care.  Signed, Lenna Gilford. Flora Lipps, MD [facility]  [address], [address] [place], [place]  [address] [phone]  10/03/2020 1:32 PM

## 2020-10-04 ENCOUNTER — Ambulatory Visit: Payer: No Typology Code available for payment source | Admitting: Cardiovascular Disease

## 2020-10-04 DIAGNOSIS — R0789 Other chest pain: Secondary | ICD-10-CM

## 2020-10-11 NOTE — Progress Notes (Deleted)
Cardiology Office Note:   Date:  10/11/2020  NAME:  Curtis Deleon    MRN: [id number] DOB:  [birth year]   PCP:  No primary care provider on file.  Cardiologist:  Reatha Harps, MD  Electrophysiologist:  None   Referring MD: Overton Mam, DO   No chief complaint on file. ***  History of Present Illness:   Curtis Deleon is a 29 y.o. male with a hx of asthma who is being seen today for the evaluation of shortness of breath at the request of Overton Mam, DO. He was seen by PCP recently for asthma. Concerns about family history of CAD.   Past Medical History: Past Medical History:  Diagnosis Date   Acute medial meniscus tear of left knee    Asthma    Tears of meniscus and ACL of left knee, subsequent encounter     Past Surgical History: Past Surgical History:  Procedure Laterality Date   KNEE ARTHROSCOPY WITH ANTERIOR CRUCIATE LIGAMENT (ACL) REPAIR WITH HAMSTRING GRAFT Left 08/07/2017   Procedure: LEFT KNEE ARTHROSCOPY WITH ANTERIOR CRUCIATE LIGAMENT (ACL) REPAIR WITH HAMSTRING GRAFT;  Surgeon: Salvatore Marvel, MD;  Location: [facility];  Service: Orthopedics;  Laterality: Left;   KNEE ARTHROSCOPY WITH MEDIAL MENISECTOMY Left 08/07/2017   Procedure: LEFT KNEE ARTHROSCOPY WITH MEDIAL MENISECTOMY;  Surgeon: Salvatore Marvel, MD;  Location: [facility];  Service: Orthopedics;  Laterality: Left;   NO PAST SURGERIES      Current Medications: No outpatient medications have been marked as taking for the 10/12/20 encounter (Appointment) with O'Neal, Ronnald Ramp, MD.     Allergies:    Patient has no known allergies.   Social History: Social History   Socioeconomic History   Marital status: Single    Spouse name: Not on file   Number of children: Not on file   Years of education: Not on file   Highest education level: Not on file  Occupational History   Not on file  Tobacco Use   Smoking status: Never Smoker   Smokeless  tobacco: Never Used  Vaping Use   Vaping Use: Never used  Substance and Sexual Activity   Alcohol use: No   Drug use: No   Sexual activity: Not on file  Other Topics Concern   Not on file  Social History Narrative   Not on file   Social Determinants of Health   Financial Resource Strain:    Difficulty of Paying Living Expenses: Not on file  Food Insecurity:    Worried About Running Out of Food in the Last Year: Not on file   Ran Out of Food in the Last Year: Not on file  Transportation Needs:    Lack of Transportation (Medical): Not on file   Lack of Transportation (Non-Medical): Not on file  Physical Activity:    Days of Exercise per Week: Not on file   Minutes of Exercise per Session: Not on file  Stress:    Feeling of Stress : Not on file  Social Connections:    Frequency of Communication with Friends and Family: Not on file   Frequency of Social Gatherings with Friends and Family: Not on file   Attends Religious Services: Not on file   Active Member of Clubs or Organizations: Not on file   Attends Banker Meetings: Not on file   Marital Status: Not on file     Family History: The patient's ***family history includes Alcohol abuse in his maternal  grandfather and paternal grandfather; COPD in his paternal grandfather; Cancer in his paternal grandfather and paternal grandmother; Hyperlipidemia in his paternal grandfather; Hypertension in his maternal grandfather; Stroke in his paternal grandfather.  ROS:   All other ROS reviewed and negative. Pertinent positives noted in the HPI.     EKGs/Labs/Other Studies Reviewed:   The following studies were personally reviewed by me today:  EKG:  EKG is *** ordered today.  The ekg ordered today demonstrates ***, and was personally reviewed by me.   Recent Labs: 10/31/2019: ALT 16; BUN 15; Creatinine, Ser 1.15; Potassium 4.0; Sodium 142; TSH 1.78 11/27/2019: Hemoglobin 16.2; Platelets 202.0    Recent Lipid Panel    Component Value Date/Time   CHOL 160 11/27/2019 0848   TRIG 57.0 11/27/2019 0848   HDL 54.00 11/27/2019 0848   CHOLHDL 3 11/27/2019 0848   VLDL 11.4 11/27/2019 0848   LDLCALC 95 11/27/2019 0848    Physical Exam:   VS:  There were no vitals taken for this visit.   Wt Readings from Last 3 Encounters:  08/19/20 151 lb 12.8 oz (68.9 kg)  10/31/19 150 lb 6.4 oz (68.2 kg)  06/10/18 147 lb 9.6 oz (67 kg)    General: Well nourished, well developed, in no acute distress Heart: Atraumatic, normal size  Eyes: PEERLA, EOMI  Neck: Supple, no JVD Endocrine: No thryomegaly Cardiac: Normal S1, S2; RRR; no murmurs, rubs, or gallops Lungs: Clear to auscultation bilaterally, no wheezing, rhonchi or rales  Abd: Soft, nontender, no hepatomegaly  Ext: No edema, pulses 2+ Musculoskeletal: No deformities, BUE and BLE strength normal and equal Skin: Warm and dry, no rashes   Neuro: Alert and oriented to person, place, time, and situation, CNII-XII grossly intact, no focal deficits  Psych: Normal mood and affect   ASSESSMENT:   Curtis Deleon is a 29 y.o. male who presents for the following: No diagnosis found.  PLAN:   There are no diagnoses linked to this encounter.  Disposition: No follow-ups on file.  Medication Adjustments/Labs and Tests Ordered: Current medicines are reviewed at length with the patient today.  Concerns regarding medicines are outlined above.  No orders of the defined types were placed in this encounter.  No orders of the defined types were placed in this encounter.   There are no Patient Instructions on file for this visit.   Time Spent with Patient: I have spent a total of *** minutes with patient reviewing hospital notes, telemetry, EKGs, labs and examining the patient as well as establishing an assessment and plan that was discussed with the patient.  > 50% of time was spent in direct patient care.  Signed, Lenna Gilford. Flora Lipps, MD [facility]  [address] [place], [place] [address] [phone]  10/11/2020 7:23 PM

## 2020-10-12 ENCOUNTER — Ambulatory Visit: Payer: No Typology Code available for payment source | Admitting: Cardiovascular Disease

## 2020-10-12 ENCOUNTER — Encounter: Payer: Self-pay | Admitting: Cardiovascular Disease

## 2020-10-12 ENCOUNTER — Ambulatory Visit (INDEPENDENT_AMBULATORY_CARE_PROVIDER_SITE_OTHER): Payer: No Typology Code available for payment source | Admitting: Cardiovascular Disease

## 2020-10-12 ENCOUNTER — Other Ambulatory Visit: Payer: Self-pay

## 2020-10-12 VITALS — BP 116/62 | HR 56 | Ht 71.0 in | Wt 156.4 lb

## 2020-10-12 DIAGNOSIS — R0602 Shortness of breath: Secondary | ICD-10-CM

## 2020-10-12 DIAGNOSIS — Z8249 Family history of ischemic heart disease and other diseases of the circulatory system: Secondary | ICD-10-CM

## 2020-10-12 NOTE — Progress Notes (Signed)
Cardiology Office Note:   Date:  10/12/2020  NAME:  Curtis Deleon    MRN: [id number] DOB:  [birth year]   PCP:  Patient, No Pcp Per  Cardiologist:  Reatha Harps, MD   Referring MD: Overton Mam, DO   Chief Complaint  Patient presents with  . Shortness of Breath   History of Present Illness:   Curtis Deleon is a 29 y.o. male with a hx of asthma who is being seen today for the evaluation of SOB/chest pain at the request of Overton Mam, DO.  He reports he has had nearly 1 year of intermittent episodes of chest pain or shortness of breath.  Apparently during the coronavirus pandemic he was working full-time as an Tree surgeon.  He reports he had an erratic schedule where he was working all night.  He reports he was having a very poor diet at the time as well.  He was not eating more than 1 meal a day.  He would just consume as much food as he could.  He reports that he was also forgetting meals.  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  He reports anxiety about going back to the classroom.  Also was concerned about Covid.  His sleep was erratic.  He would nap during the afternoons and work all night.  Apparently was having intermittent episodes of chest pain that would last seconds.  They would occur at random.  They appear to have resolved.  He also reports that he would have shortness of breath intermittently.  Reports when walking fast or doing certain activities he would be out of breath he reports that he still maintains a high level of activity.  He plays ultimate [place] as well as volleyball.  He does this several times a week and can exercise for up to 1 hour without any chest pain or shortness of breath.  His blood pressure is normal today.  His body weight is normal.  No recent thyroid.  His EKG demonstrates an incomplete right bundle branch block.  He is never had a heart attack or stroke.  He does not smoke.  No alcohol or drug use is reported.  He  does report he is possibly been depressed.  He also reports stress in his life.  He also reports a constellation of symptoms including abdominal pain and neurologic symptoms such as numbness and tingling in his lower extremities.  He reports that his grandparents had heart disease.  He is quite concerned he may have heart disease.  His cardiovascular examination is normal.   Past Medical History: Past Medical History:  Diagnosis Date  . Acute medial meniscus tear of left knee   . Asthma   . Tears of meniscus and ACL of left knee, subsequent encounter     Past Surgical History: Past Surgical History:  Procedure Laterality Date  . KNEE ARTHROSCOPY WITH ANTERIOR CRUCIATE LIGAMENT (ACL) REPAIR WITH HAMSTRING GRAFT Left 08/07/2017   Procedure: LEFT KNEE ARTHROSCOPY WITH ANTERIOR CRUCIATE LIGAMENT (ACL) REPAIR WITH HAMSTRING GRAFT;  Surgeon: Salvatore Marvel, MD;  Location: [facility];  Service: Orthopedics;  Laterality: Left;  . KNEE ARTHROSCOPY WITH MEDIAL MENISECTOMY Left 08/07/2017   Procedure: LEFT KNEE ARTHROSCOPY WITH MEDIAL MENISECTOMY;  Surgeon: Salvatore Marvel, MD;  Location: [facility];  Service: Orthopedics;  Laterality: Left;  . NO PAST SURGERIES      Current Medications: Current Meds  Medication Sig  . albuterol (VENTOLIN HFA) 108 (  90 Base) MCG/ACT inhaler Inhale 2 puffs into the lungs every 6 (six) hours as needed for wheezing or shortness of breath.     Allergies:    Patient has no known allergies.   Social History: Social History   Socioeconomic History  . Marital status: Single    Spouse name: Not on file  . Number of children: Not on file  . Years of education: Not on file  . Highest education level: Not on file  Occupational History  . Not on file  Tobacco Use  . Smoking status: Never Smoker  . Smokeless tobacco: Never Used  Vaping Use  . Vaping Use: Never used  Substance and Sexual Activity  . Alcohol use: No  . Drug use: No  .  Sexual activity: Not on file  Other Topics Concern  . Not on file  Social History Narrative  . Not on file   Social Determinants of Health   Financial Resource Strain:   . Difficulty of Paying Living Expenses: Not on file  Food Insecurity:   . Worried About Programme researcher, broadcasting/film/video in the Last Year: Not on file  . Ran Out of Food in the Last Year: Not on file  Transportation Needs:   . Lack of Transportation (Medical): Not on file  . Lack of Transportation (Non-Medical): Not on file  Physical Activity:   . Days of Exercise per Week: Not on file  . Minutes of Exercise per Session: Not on file  Stress:   . Feeling of Stress : Not on file  Social Connections:   . Frequency of Communication with Friends and Family: Not on file  . Frequency of Social Gatherings with Friends and Family: Not on file  . Attends Religious Services: Not on file  . Active Member of Clubs or Organizations: Not on file  . Attends Banker Meetings: Not on file  . Marital Status: Not on file     Family History: The patient's family history includes Alcohol abuse in his maternal grandfather and paternal grandfather; COPD in his paternal grandfather; Cancer in his paternal grandfather and paternal grandmother; Diabetes in his mother; Heart disease in his paternal grandfather and paternal grandmother; Hyperlipidemia in his paternal grandfather; Hypertension in his maternal grandfather; Stroke in his paternal grandfather.  ROS:   All other ROS reviewed and negative. Pertinent positives noted in the HPI.     EKGs/Labs/Other Studies Reviewed:   The following studies were personally reviewed by me today:  EKG:  EKG is ordered today.  The ekg ordered today demonstrates sinus bradycardia, heart rate 56, no acute ischemic changes, no evidence of prior infarction, and was personally reviewed by me.   Recent Labs: 10/31/2019: ALT 16; BUN 15; Creatinine, Ser 1.15; Potassium 4.0; Sodium 142; TSH  1.78 11/27/2019: Hemoglobin 16.2; Platelets 202.0   Recent Lipid Panel    Component Value Date/Time   CHOL 160 11/27/2019 0848   TRIG 57.0 11/27/2019 0848   HDL 54.00 11/27/2019 0848   CHOLHDL 3 11/27/2019 0848   VLDL 11.4 11/27/2019 0848   LDLCALC 95 11/27/2019 0848    Physical Exam:   VS:  BP 116/62   Pulse (!) 56   Ht 5\' 11"  (1.803 m)   Wt 156 lb 6.4 oz (70.9 kg)   BMI 21.81 kg/m    Wt Readings from Last 3 Encounters:  10/12/20 156 lb 6.4 oz (70.9 kg)  08/19/20 151 lb 12.8 oz (68.9 kg)  10/31/19 150 lb 6.4 oz (68.2  kg)    General: Well nourished, well developed, in no acute distress Heart: Atraumatic, normal size  Eyes: PEERLA, EOMI  Neck: Supple, no JVD Endocrine: No thryomegaly Cardiac: Normal S1, S2; RRR; no murmurs, rubs, or gallops Lungs: Clear to auscultation bilaterally, no wheezing, rhonchi or rales  Abd: Soft, nontender, no hepatomegaly  Ext: No edema, pulses 2+ Musculoskeletal: No deformities, BUE and BLE strength normal and equal Skin: Warm and dry, no rashes   Neuro: Alert and oriented to person, place, time, and situation, CNII-XII grossly intact, no focal deficits  Psych: Normal mood and affect   ASSESSMENT:   Curtis Deleon is a 29 y.o. male who presents for the following: 1. SOB (shortness of breath)     PLAN:   1. SOB (shortness of breath) -Intermittent symptoms.  Appear to be improving.  EKG today is normal with no evidence of prior infarction or stroke.  His cardiovascular examination is normal without murmur, rubs or gallops.  He has no evidence of heart failure.  Overall, I think he is stressed.  I think his erratic sleeping patterns have caused significant physical stress on his body.  We do need to check a TSH.  I would not have recommended regular exercise, regular diet and stress reduction strategies.  I see no need for further cardiac testing.  Disposition: Return if symptoms worsen or fail to improve.  Medication Adjustments/Labs and  Tests Ordered: Current medicines are reviewed at length with the patient today.  Concerns regarding medicines are outlined above.  Orders Placed This Encounter  Procedures  . TSH  . EKG 12-Lead   No orders of the defined types were placed in this encounter.   Patient Instructions  Medication Instructions:  No change   Lab Work: TSH today   Testing/Procedures: None ordered   Follow-Up: At [facility], you and your health needs are our priority.  As part of our continuing mission to provide you with exceptional heart care, we have created designated Provider Care Teams.  These Care Teams include your primary Cardiologist (physician) and Advanced Practice Providers (APPs -  Physician Assistants and Nurse Practitioners) who all work together to provide you with the care you need, when you need it.  We recommend signing up for the patient portal called "MyChart".  Sign up information is provided on this After Visit Summary.  MyChart is used to connect with patients for Virtual Visits (Telemedicine).  Patients are able to view lab/test results, encounter notes, upcoming appointments, etc.  Non-urgent messages can be sent to your provider as well.   To learn more about what you can do with MyChart, go to [email or web address].    Your next appointment:  As needed   The format for your next appointment: Office     Provider:  Dr.O'Neal     Signed, Lenna Gilford. Flora Lipps, MD [facility]  [address], [address] [place], [place] [address] [phone]  10/12/2020 4:55 PM

## 2020-10-12 NOTE — Patient Instructions (Signed)
Medication Instructions:  No change   Lab Work: TSH today   Testing/Procedures: None ordered   Follow-Up: At [facility], you and your health needs are our priority.  As part of our continuing mission to provide you with exceptional heart care, we have created designated Provider Care Teams.  These Care Teams include your primary Cardiologist (physician) and Advanced Practice Providers (APPs -  Physician Assistants and Nurse Practitioners) who all work together to provide you with the care you need, when you need it.  We recommend signing up for the patient portal called "MyChart".  Sign up information is provided on this After Visit Summary.  MyChart is used to connect with patients for Virtual Visits (Telemedicine).  Patients are able to view lab/test results, encounter notes, upcoming appointments, etc.  Non-urgent messages can be sent to your provider as well.   To learn more about what you can do with MyChart, go to [email or web address].    Your next appointment:  As needed   The format for your next appointment: Office     Provider:  Dr.O'Neal

## 2020-10-13 LAB — TSH: TSH: 1.68 u[IU]/mL (ref 0.450–4.500)

## 2020-11-26 ENCOUNTER — Other Ambulatory Visit: Payer: Self-pay

## 2020-12-23 ENCOUNTER — Encounter: Payer: No Typology Code available for payment source | Admitting: Family Medicine

## 2021-01-24 ENCOUNTER — Encounter: Payer: No Typology Code available for payment source | Admitting: Family Medicine

## 2021-03-02 ENCOUNTER — Encounter: Payer: No Typology Code available for payment source | Admitting: Family Medicine

## 2023-03-12 DIAGNOSIS — H10521 Angular blepharoconjunctivitis, right eye: Secondary | ICD-10-CM | POA: Diagnosis not present
